# Patient Record
Sex: Female | Born: 1975 | Race: Black or African American | Hispanic: Yes | Marital: Married | Smoking: Never smoker
Health system: Southern US, Community
[De-identification: ages and names within clinical notes are randomized; demographics above are authoritative.]

## PROBLEM LIST (undated history)

## (undated) DIAGNOSIS — Z973 Presence of spectacles and contact lenses: Secondary | ICD-10-CM

## (undated) DIAGNOSIS — M778 Other enthesopathies, not elsewhere classified: Secondary | ICD-10-CM

---

## 1998-03-07 ENCOUNTER — Other Ambulatory Visit: Admission: RE | Admit: 1998-03-07 | Discharge: 1998-03-07 | Payer: Self-pay | Admitting: Obstetrics and Gynecology

## 2018-08-23 DIAGNOSIS — M25871 Other specified joint disorders, right ankle and foot: Secondary | ICD-10-CM

## 2018-08-23 HISTORY — DX: Other specified joint disorders, right ankle and foot: M25.871

## 2018-10-05 DIAGNOSIS — M5431 Sciatica, right side: Secondary | ICD-10-CM

## 2018-10-05 HISTORY — DX: Sciatica, right side: M54.31

## 2020-10-09 ENCOUNTER — Other Ambulatory Visit: Payer: Self-pay

## 2020-10-09 ENCOUNTER — Emergency Department (INDEPENDENT_AMBULATORY_CARE_PROVIDER_SITE_OTHER)
Admission: EM | Admit: 2020-10-09 | Discharge: 2020-10-09 | Disposition: A | Discharge status: Home / Self Care | Payer: BC Managed Care – PPO | Source: Home / Self Care

## 2020-10-09 DIAGNOSIS — Z20822 Contact with and (suspected) exposure to covid-19: Secondary | ICD-10-CM

## 2020-10-09 DIAGNOSIS — B349 Viral infection, unspecified: Secondary | ICD-10-CM

## 2020-10-09 NOTE — Discharge Instructions (Signed)
Your COVID 19 results will be available in 3-5 days. Negative results are immediately resulted to Mychart. Positive results will receive a follow-up call from our clinic. If symptoms are present, I recommend home quarantine until results are known.  °

## 2020-10-09 NOTE — ED Provider Notes (Signed)
[facility]    CSN: [id number] Arrival date & time: 10/09/20  0844      History   Chief Complaint Chief Complaint  Patient presents with  . Sore Throat  . Generalized Body Aches  . Chills    HPI Wendy Wu is a 45 y.o. female.   HPI Patient presents with URI symptoms including sore throat, generalized body aches, chills, nasal congestion. Known COVID exposure. COVID Vaccinated: Y Denies worrisome symptoms of shortness of breath, weakness, N&V, chest pain.  History reviewed. No pertinent past medical history.  There are no problems to display for this patient.   History reviewed. No pertinent surgical history.  OB History   No obstetric history on file.      Home Medications    Prior to Admission medications   Not on File    Family History History reviewed. No pertinent family history.  Social History Social History   Tobacco Use  . Smoking status: Never Smoker  . Smokeless tobacco: Never Used  Vaping Use  . Vaping Use: Never used  Substance Use Topics  . Alcohol use: Not Currently     Allergies   Patient has no known allergies.   Review of Systems Review of Systems Pertinent negatives listed in HPI   Physical Exam Triage Vital Signs ED Triage Vitals  Enc Vitals Group     BP 10/09/20 0908 115/77     Pulse Rate 10/09/20 0908 92     Resp 10/09/20 0908 18     Temp 10/09/20 0915 99.6 F (37.6 C)     Temp Source 10/09/20 0908 Oral     SpO2 10/09/20 0908 96 %     Weight --      Height --      Head Circumference --      Peak Flow --      Pain Score 10/09/20 0908 5     Pain Loc --      Pain Edu? --      Excl. in GC? --    No data found.  Updated Vital Signs BP 115/77 (BP Location: Right Arm)   Pulse 92   Temp 99.6 F (37.6 C) (Oral)   Resp 18   SpO2 96%   Visual Acuity Right Eye Distance:   Left Eye Distance:   Bilateral Distance:    Right Eye Near:   Left Eye Near:    Bilateral Near:     Physical  Exam  General Appearance:    Alert, acutely ill-appearing, cooperative, no distress  HENT:   Normocephalic, ears normal, nares mucosal edema with congestion, rhinorrhea, oropharynx w/o exudate  Eyes:    PERRL, conjunctiva/corneas clear, EOM's intact       Lungs:     Clear to auscultation bilaterally, respirations unlabored  Heart:    Regular rate and rhythm  Neurologic:   Awake, alert, oriented x 3. No apparent focal neurological           defect.      UC Treatments / Results  Labs (all labs ordered are listed, but only abnormal results are displayed) Labs Reviewed - No data to display  EKG   Radiology No results found.  Procedures Procedures (including critical care time)  Medications Ordered in UC Medications - No data to display  Initial Impression / Assessment and Plan / UC Course  I have reviewed the triage vital signs and the nursing notes.  Pertinent labs & imaging results that were available during  my care of the patient were reviewed by me and considered in my medical decision making (see chart for details).    COVID/Flu test pending. Symptom management warranted only.  Manage fever with Tylenol and ibuprofen.  Nasal symptoms with over-the-counter antihistamines recommended.  Treatment per discharge medications/discharge instructions.  Red flags/ER precautions given. The most current CDC isolation/quarantine recommendation advised.   Final Clinical Impressions(s) / UC Diagnoses   Final diagnoses:  Viral illness  Close exposure to COVID-19 virus     Discharge Instructions     Your COVID 19 results will be available in 3-5 days. Negative results are immediately resulted to Mychart. Positive results will receive a follow-up call from our clinic. If symptoms are present, I recommend home quarantine until results are known.    ED Prescriptions    None     PDMP not reviewed this encounter.   Bing Neighbors, FNP 10/16/20 1103

## 2020-10-09 NOTE — ED Triage Notes (Signed)
Pt c/o sore throat, bodyaches and chills since yesterday. Also says mid morning started feeling some abd pain. Pain 5/10. Advil prn.  [facility], tested neg. Husband tested pos here at [facility] on Saturday. Has had J&J covid vaccine last July.

## 2020-10-10 ENCOUNTER — Ambulatory Visit: Payer: Self-pay | Admitting: Family Medicine

## 2020-10-11 LAB — COVID-19, FLU A+B NAA
Influenza A, NAA: NOT DETECTED
Influenza B, NAA: NOT DETECTED
SARS-CoV-2, NAA: DETECTED — AB

## 2020-10-18 DIAGNOSIS — U071 COVID-19: Secondary | ICD-10-CM

## 2020-10-18 HISTORY — DX: COVID-19: U07.1

## 2020-10-22 ENCOUNTER — Ambulatory Visit: Payer: Self-pay | Admitting: Family Medicine

## 2020-10-22 NOTE — Progress Notes (Signed)
[facility] [address] [place] [address] Phone: [phone] Subjective:   Bruce Donath, am serving as a scribe for Dr. Antoine Primas. This visit occurred during the SARS-CoV-2 public health emergency.  Safety protocols were in place, including screening questions prior to the visit, additional usage of staff PPE, and extensive cleaning of exam room while observing appropriate contact time as indicated for disinfecting solutions.   I'm seeing this patient by the request  of:  Pcp, No  CC: Low back pain  [id number]  Wendy Wu is a 45 y.o. female coming in with complaint of low back pain for one month. Patient states that she has pain in right SI joint that radiates down into hamstring to the left heel. Using Advil for pain relief. History of back injury in high school after doing squats. Was getting B12 injection in [place] 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 Had a custom brace molded for patient during 2nd pregnancy at age 21. Working at [employer] for past 14 years at [employer] and moves and lifts heavy items frequently. Pain increases with lumbar flexion. Saw another chiropractor 5 years ago and was told that she has a bulging disc. Has used heat, ice, and IBU for pain relief since middle of December, 600mg  daily.   Also peroneal tendonitis on right ankle. Was seen by provider in [facility] and given injection. Pain with PF.      No past medical history on file. No past surgical history on file. Social History   Socioeconomic History  . Marital status: Married    Spouse name: Not on file  . Number of children: Not on file  . Years of education: Not on file  . Highest education level: Not on file  Occupational History  . Not on file  Tobacco Use  . Smoking status: Never Smoker  . Smokeless tobacco: Never Used  Vaping Use  . Vaping Use: Never  used  Substance and Sexual Activity  . Alcohol use: Not Currently  . Drug use: Not on file  . Sexual activity: Not on file  Other Topics Concern  . Not on file  Social History Narrative   ** Merged History Encounter **       Social Determinants of Health   Financial Resource Strain: Not on file  Food Insecurity: Not on file  Transportation Needs: Not on file  Physical Activity: Not on file  Stress: Not on file  Social Connections: Not on file   No Known Allergies No family history on file.       Current Outpatient Medications (Other):  .  gabapentin (NEURONTIN) 100 MG capsule, Take 2 capsules (200 mg total) by mouth at bedtime.   Reviewed prior external information including notes and imaging from  primary care provider As well as notes that were available from care everywhere and other healthcare systems.  Past medical history, social, surgical and family history all reviewed in electronic medical record.  No pertanent information unless stated regarding to the chief complaint.   Review of Systems:  No headache, visual changes, nausea, vomiting, diarrhea, constipation, dizziness, abdominal pain, skin rash, fevers, chills, night sweats, weight loss, swollen lymph nodes  joint swelling, chest pain, shortness of breath, mood changes. POSITIVE muscle aches, body aches  Objective  Blood pressure 118/80, pulse 81, weight 181 lb (82.1 kg), last menstrual period 10/09/2020, SpO2  99 %.   General: No apparent distress alert and oriented x3 mood and affect normal, dressed appropriately.  HEENT: Pupils equal, extraocular movements intact  Respiratory: Patient's speak in full sentences and does not appear short of breath  Cardiovascular: No lower extremity edema, non tender, no erythema  Gait favoring right side  MSK: Right knee unremarkable but with straight leg test patient has worsening pain on the lateral aspect of the knee.  No significant instability of the knee.  Patient  points to more pain on the posterior aspect of the knee.  No masses appreciated.  No pain in the calf.  No swelling of the lower extremities compared to the contralateral side Low back exam does have significant loss of lordosis.  Patient actually has tenderness to palpation in the paraspinal musculature right greater than left.  Positive straight leg at 25 degrees of forward flexion with radicular symptoms in the sciatic nerve or S1 distribution.  Patient may have some very mild weakness with dorsiflexion.  Deep tendon reflexes though appear to be intact possibly 1+ on the right and 2+ on the left there is some asymmetry noted.  Limited range of motion of the back secondary to voluntary guarding.    Impression and Recommendations:     The above documentation has been reviewed and is accurate and complete Judi Saa, DO

## 2020-10-23 ENCOUNTER — Other Ambulatory Visit: Payer: Self-pay

## 2020-10-23 ENCOUNTER — Ambulatory Visit (INDEPENDENT_AMBULATORY_CARE_PROVIDER_SITE_OTHER): Payer: BC Managed Care – PPO

## 2020-10-23 ENCOUNTER — Encounter: Payer: Self-pay | Admitting: Family Medicine

## 2020-10-23 ENCOUNTER — Ambulatory Visit (INDEPENDENT_AMBULATORY_CARE_PROVIDER_SITE_OTHER): Payer: BC Managed Care – PPO | Admitting: Family Medicine

## 2020-10-23 VITALS — BP 118/80 | HR 81 | Wt 181.0 lb

## 2020-10-23 DIAGNOSIS — M2550 Pain in unspecified joint: Secondary | ICD-10-CM | POA: Diagnosis not present

## 2020-10-23 DIAGNOSIS — M545 Low back pain, unspecified: Secondary | ICD-10-CM | POA: Diagnosis not present

## 2020-10-23 DIAGNOSIS — M5416 Radiculopathy, lumbar region: Secondary | ICD-10-CM | POA: Diagnosis not present

## 2020-10-23 LAB — CBC WITH DIFFERENTIAL/PLATELET
Basophils Absolute: 0 10*3/uL (ref 0.0–0.1)
Basophils Relative: 0.2 % (ref 0.0–3.0)
Eosinophils Absolute: 0 10*3/uL (ref 0.0–0.7)
Eosinophils Relative: 0.7 % (ref 0.0–5.0)
HCT: 38 % (ref 36.0–46.0)
Hemoglobin: 12.5 g/dL (ref 12.0–15.0)
Lymphocytes Relative: 26.7 % (ref 12.0–46.0)
Lymphs Abs: 1.8 10*3/uL (ref 0.7–4.0)
MCHC: 32.9 g/dL (ref 30.0–36.0)
MCV: 85 fl (ref 78.0–100.0)
Monocytes Absolute: 0.5 10*3/uL (ref 0.1–1.0)
Monocytes Relative: 7.8 % (ref 3.0–12.0)
Neutro Abs: 4.5 10*3/uL (ref 1.4–7.7)
Neutrophils Relative %: 64.6 % (ref 43.0–77.0)
Platelets: 518 10*3/uL — ABNORMAL HIGH (ref 150.0–400.0)
RBC: 4.47 Mil/uL (ref 3.87–5.11)
RDW: 13.6 % (ref 11.5–15.5)
WBC: 6.9 10*3/uL (ref 4.0–10.5)

## 2020-10-23 LAB — COMPREHENSIVE METABOLIC PANEL
ALT: 15 U/L (ref 0–35)
AST: 14 U/L (ref 0–37)
Albumin: 4.6 g/dL (ref 3.5–5.2)
Alkaline Phosphatase: 34 U/L — ABNORMAL LOW (ref 39–117)
BUN: 13 mg/dL (ref 6–23)
CO2: 30 mEq/L (ref 19–32)
Calcium: 9.9 mg/dL (ref 8.4–10.5)
Chloride: 103 mEq/L (ref 96–112)
Creatinine, Ser: 0.8 mg/dL (ref 0.40–1.20)
GFR: 88.84 mL/min (ref >60.00)
Glucose, Bld: 81 mg/dL (ref 70–99)
Potassium: 3.9 mEq/L (ref 3.5–5.1)
Sodium: 138 mEq/L (ref 135–145)
Total Bilirubin: 0.4 mg/dL (ref 0.2–1.2)
Total Protein: 7.6 g/dL (ref 6.0–8.3)

## 2020-10-23 LAB — IBC PANEL
Iron: 102 ug/dL (ref 42–145)
Saturation Ratios: 30.6 % (ref 20.0–50.0)
Transferrin: 238 mg/dL (ref 212.0–360.0)

## 2020-10-23 LAB — VITAMIN D 25 HYDROXY (VIT D DEFICIENCY, FRACTURES): VITD: 16.15 ng/mL — ABNORMAL LOW (ref 30.00–100.00)

## 2020-10-23 LAB — TSH: TSH: 1.07 u[IU]/mL (ref 0.35–4.50)

## 2020-10-23 LAB — SEDIMENTATION RATE: Sed Rate: 23 mm/hr — ABNORMAL HIGH (ref 0–20)

## 2020-10-23 LAB — URIC ACID: Uric Acid, Serum: 2.8 mg/dL (ref 2.4–7.0)

## 2020-10-23 MED ORDER — GABAPENTIN 100 MG PO CAPS: 200.0000 mg | ORAL_CAPSULE | Freq: Every day | ORAL | 0 refills | Status: DC

## 2020-10-23 NOTE — Assessment & Plan Note (Signed)
Patient is having lumbar radiculopathy.  No significant weakness but potentially some deep tendon reflexes decreased on the side.  We will get x-rays to further evaluate but I do feel with patient already failing formal physical therapy, chiropractor, over-the-counter medications and years of pain I do feel advanced imaging is warranted at this time.  Patient will have the MRI and then we will further evaluate to see if patient is a candidate for possible epidurals versus the possibility of surgical intervention.  We will get some laboratory work-up as well to make sure nothing else is contributing with patient not having a primary care provider.  Encouraged her to get a primary care provider soon.  Started on gabapentin for the radicular symptoms and patient can continue with the anti-inflammatories.  Patient declined multiple different medications including prednisone today.  Patient will follow-up after imaging and discuss otherwise.

## 2020-10-23 NOTE — Patient Instructions (Addendum)
MRI lumbar [facility] [phone] Gabapentin 200mg  at night Xray right knee today Labs today We will be in touch after MRI

## 2020-10-24 ENCOUNTER — Telehealth: Payer: Self-pay | Admitting: Family Medicine

## 2020-10-24 ENCOUNTER — Other Ambulatory Visit: Payer: Self-pay

## 2020-10-24 MED ORDER — GABAPENTIN 100 MG PO CAPS: 200.0000 mg | ORAL_CAPSULE | Freq: Every day | ORAL | 0 refills | Status: DC

## 2020-10-24 NOTE — Telephone Encounter (Signed)
Rx called in . Patient notified

## 2020-10-24 NOTE — Telephone Encounter (Signed)
Patient called stating that her prescription coverage changed so [facility] are [facility]. [facility] [employer] [facility] [address] in [place], [place]?

## 2020-10-28 ENCOUNTER — Telehealth: Payer: Self-pay | Admitting: Family Medicine

## 2020-10-28 ENCOUNTER — Ambulatory Visit: Payer: Self-pay | Admitting: Family Medicine

## 2020-10-28 NOTE — Telephone Encounter (Signed)
Sent patient MyChart message with update. 

## 2020-10-28 NOTE — Telephone Encounter (Signed)
Pt feels like gabapentin has not made any difference in her pain, still wakes up and is still taking Advil 2-3 times a day. Has been taking the gabapentin since 1/20.  Also noted constant tingling in R foot.  MRI scheduled at [facility] for 2/8, she is willing to go elsewhere if she can get I earlier.

## 2020-10-28 NOTE — Telephone Encounter (Signed)
I would give the gabapentin more time,  If she wants we can check Wendy Wu if that is any better for the MRI

## 2020-11-02 ENCOUNTER — Other Ambulatory Visit: Payer: Self-pay

## 2020-11-02 ENCOUNTER — Ambulatory Visit (INDEPENDENT_AMBULATORY_CARE_PROVIDER_SITE_OTHER): Payer: BC Managed Care – PPO

## 2020-11-02 DIAGNOSIS — M5136 Other intervertebral disc degeneration, lumbar region: Secondary | ICD-10-CM

## 2020-11-02 DIAGNOSIS — M545 Low back pain, unspecified: Secondary | ICD-10-CM

## 2020-11-02 DIAGNOSIS — M5126 Other intervertebral disc displacement, lumbar region: Secondary | ICD-10-CM | POA: Diagnosis not present

## 2020-11-05 ENCOUNTER — Other Ambulatory Visit: Payer: Self-pay

## 2020-11-05 ENCOUNTER — Telehealth: Payer: Self-pay | Admitting: *Deleted

## 2020-11-05 DIAGNOSIS — M5416 Radiculopathy, lumbar region: Secondary | ICD-10-CM

## 2020-11-05 NOTE — Telephone Encounter (Signed)
Spoke to pt, she is interested in the injection but would like more detailed information.

## 2020-11-05 NOTE — Telephone Encounter (Signed)
Patient is going to write back tomorrow if she would like to do a phone conversation at 4:15pm on 11/07/2020.

## 2020-11-06 NOTE — Progress Notes (Signed)
Virtual Visit via Video Note  I connected with Wendy Wu on 11/06/20 at  4:15 PM EST by a video enabled telemedicine application and verified that I am speaking with the correct person using two identifiers.  Had difficulty with virtual platform so did the phone call.  Patient is accompanied with husband  Location: Patient: And home setting Provider: In office setting   I discussed the limitations of evaluation and management by telemedicine and the availability of in person appointments. The patient expressed understanding and agreed to proceed.  History of Present Illness: 45 year old female who is coming in with back pain and was found to have a positive straight leg test with radicular symptoms in the S1 distribution with mild decrease in deep tendon reflexes.  Sent to have an MRI.  MRI did show that patient had an L5-S1 right paracentral extrusion with right-sided S1 compression at the subarticular recess.    Observations/Objective:alert and asked good questions    Assessment and Plan: right sided nerve root impingement S1.  Discussed different options.  Discussed epidural, nerve root injection, or surgical intervention  We discussed gabapentin, discussed concern with the weakness noted on exam.  Patient though would like to consider nerve root injection first.  Continue the    Follow Up Instructions: 4 weeks after injection if worsening symptoms patient is to call or send a my chart message and we will send patient to surgery.  Patient is in agreement with the plan     I discussed the assessment and treatment plan with the patient. The patient was provided an opportunity to ask questions and all were answered. The patient agreed with the plan and demonstrated an understanding of the instructions.   The patient was advised to call back or seek an in-person evaluation if the symptoms worsen or if the condition fails to improve as anticipated.  I provided of  non-face-to-face time during this encounter.   Judi Saa, DO

## 2020-11-07 ENCOUNTER — Encounter: Payer: Self-pay | Admitting: Family Medicine

## 2020-11-07 ENCOUNTER — Ambulatory Visit (INDEPENDENT_AMBULATORY_CARE_PROVIDER_SITE_OTHER): Payer: BC Managed Care – PPO | Admitting: Family Medicine

## 2020-11-07 DIAGNOSIS — M5416 Radiculopathy, lumbar region: Secondary | ICD-10-CM

## 2020-11-07 HISTORY — DX: Radiculopathy, lumbar region: M54.16

## 2020-11-08 ENCOUNTER — Ambulatory Visit: Payer: BC Managed Care – PPO | Admitting: Family Medicine

## 2020-11-12 ENCOUNTER — Other Ambulatory Visit: Payer: BC Managed Care – PPO

## 2020-11-18 ENCOUNTER — Other Ambulatory Visit: Payer: Self-pay

## 2020-11-18 ENCOUNTER — Ambulatory Visit
Admission: RE | Admit: 2020-11-18 | Discharge: 2020-11-18 | Disposition: A | Discharge status: Home / Self Care | Payer: BC Managed Care – PPO | Source: Ambulatory Visit | Attending: Family Medicine | Admitting: Family Medicine

## 2020-11-18 DIAGNOSIS — M5416 Radiculopathy, lumbar region: Secondary | ICD-10-CM

## 2020-11-18 IMAGING — XA DG EPIDURAL NERVE ROOT
2 series · 2 of 2 positions shown · non-contrast
Comparison: none

CLINICAL DATA: Lumbosacral spondylosis without myelopathy. Right
leg pain. Disc extrusion at L5-S1 with right S1 nerve root
impingement.

[Series 1: ortho adipose · 1 of 1 slices shown (1 of 2)]
[im 1/1]
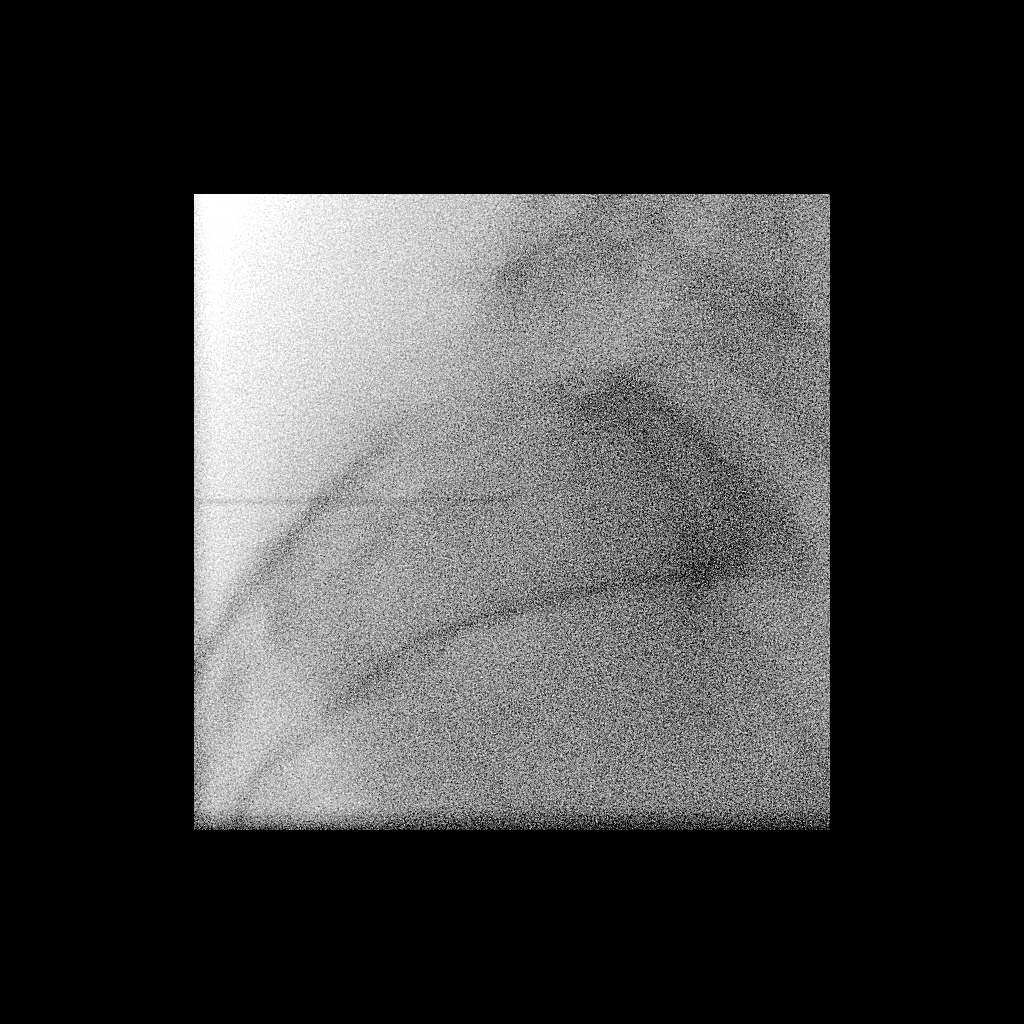

[Series 3: ortho adipose · 1 of 1 slices shown (2 of 2)]
[im 1/1]
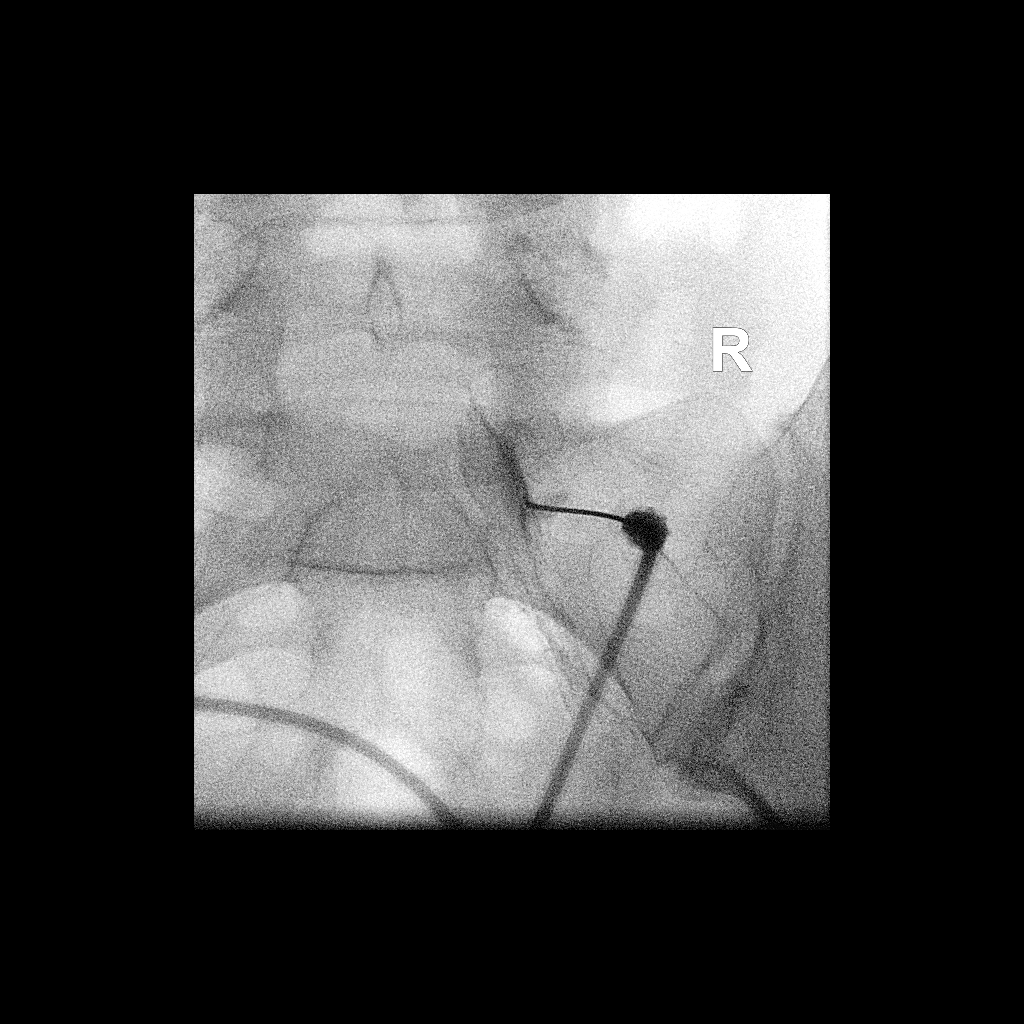

[2 of 2 positions shown; findings below may reference images not displayed]

EXAM:
EPIDURAL/NERVE ROOT

FLUOROSCOPY TIME:  Fluoroscopy Time: 23 seconds

Radiation Exposure Index: 21.1 microGray*m^2

PROCEDURE:
The procedure, risks, benefits, and alternatives were explained to
the patient. Questions regarding the procedure were encouraged and
answered. The patient understands and consents to the procedure.

RIGHT S1 NERVE ROOT BLOCK AND TRANSFORAMINAL EPIDURAL: A posterior
oblique approach was taken to the intervertebral foramen on the
right at the S1 level using a curved 3.5 inch 22 gauge spinal
needle. Injection of Isovue-M 200 outlined the right S1 nerve root
and showed good epidural spread. No vascular opacification is seen.
120 mg of Depo-Medrol mixed with 1.5 mL of 1% lidocaine were
instilled. The injection resulted in concordant pain. The procedure
was well-tolerated, and the patient was discharged thirty minutes
following the injection in good condition.

COMPLICATIONS:
None
IMPRESSION: Technically successful injection consisting of a right S1 nerve root
block and transforaminal epidural.

## 2020-11-18 MED ORDER — METHYLPREDNISOLONE ACETATE 40 MG/ML INJ SUSP (RADIOLOG
120.0000 mg | Freq: Once | INTRAMUSCULAR | Status: AC
  Administered 2020-11-18: 120 mg via EPIDURAL

## 2020-11-18 MED ORDER — IOPAMIDOL (ISOVUE-M 200) INJECTION 41%
1.0000 mL | Freq: Once | INTRAMUSCULAR | Status: AC
  Administered 2020-11-18: 1 mL via EPIDURAL

## 2020-11-18 NOTE — Discharge Instructions (Signed)

## 2021-07-12 ENCOUNTER — Other Ambulatory Visit: Payer: Self-pay | Admitting: Obstetrics and Gynecology

## 2021-09-09 ENCOUNTER — Other Ambulatory Visit: Payer: Self-pay

## 2021-09-09 ENCOUNTER — Encounter (HOSPITAL_BASED_OUTPATIENT_CLINIC_OR_DEPARTMENT_OTHER): Payer: Self-pay | Admitting: Obstetrics and Gynecology

## 2021-09-09 NOTE — Progress Notes (Signed)
Spoke w/ via phone for pre-op interview---pt Lab needs dos---- Urine pregnancy, CBC              Lab results------none COVID test -----patient states asymptomatic no test needed Arrive at -------0530 on 09/12/21 NPO after MN NO Solid Food.  Clear liquids from MN until---0430 on 09/12/21 Med rec completed Medications to take morning of surgery -----none Diabetic medication -----n/a Patient instructed no nail polish to be worn day of surgery Patient instructed to bring photo id and insurance card day of surgery Patient aware to have Driver (ride ) / caregiver    for 24 hours after surgery - friend Patient Special Instructions -----none Pre-Op special Istructions -----none Patient verbalized understanding of instructions that were given at this phone interview. Patient denies shortness of breath, chest pain, fever, cough at this phone interview.

## 2021-09-12 ENCOUNTER — Encounter (HOSPITAL_BASED_OUTPATIENT_CLINIC_OR_DEPARTMENT_OTHER)
Admission: RE | Disposition: A | Discharge status: Home / Self Care | Payer: Self-pay | Source: Home / Self Care | Attending: Obstetrics and Gynecology

## 2021-09-12 ENCOUNTER — Ambulatory Visit (HOSPITAL_BASED_OUTPATIENT_CLINIC_OR_DEPARTMENT_OTHER): Payer: BC Managed Care – PPO | Admitting: Anesthesiology

## 2021-09-12 ENCOUNTER — Other Ambulatory Visit: Payer: Self-pay

## 2021-09-12 ENCOUNTER — Encounter (HOSPITAL_BASED_OUTPATIENT_CLINIC_OR_DEPARTMENT_OTHER): Payer: Self-pay | Admitting: Obstetrics and Gynecology

## 2021-09-12 ENCOUNTER — Ambulatory Visit (HOSPITAL_BASED_OUTPATIENT_CLINIC_OR_DEPARTMENT_OTHER)
Admission: RE | Admit: 2021-09-12 | Discharge: 2021-09-12 | Disposition: A | Discharge status: Home / Self Care | Payer: BC Managed Care – PPO | Attending: Obstetrics and Gynecology | Admitting: Obstetrics and Gynecology

## 2021-09-12 DIAGNOSIS — Z302 Encounter for sterilization: Secondary | ICD-10-CM | POA: Diagnosis present

## 2021-09-12 DIAGNOSIS — Z780 Asymptomatic menopausal state: Secondary | ICD-10-CM | POA: Insufficient documentation

## 2021-09-12 HISTORY — DX: Other enthesopathies, not elsewhere classified: M77.8

## 2021-09-12 HISTORY — PX: LAPAROSCOPIC TUBAL LIGATION: SHX1937

## 2021-09-12 HISTORY — DX: Presence of spectacles and contact lenses: Z97.3

## 2021-09-12 LAB — CBC
HCT: 39.7 % (ref 36.0–46.0)
Hemoglobin: 12.6 g/dL (ref 12.0–15.0)
MCH: 28.3 pg (ref 26.0–34.0)
MCHC: 31.7 g/dL (ref 30.0–36.0)
MCV: 89 fL (ref 80.0–100.0)
Platelets: 379 10*3/uL (ref 150–400)
RBC: 4.46 MIL/uL (ref 3.87–5.11)
RDW: 13 % (ref 11.5–15.5)
WBC: 8.2 10*3/uL (ref 4.0–10.5)
nRBC: 0 % (ref 0.0–0.2)

## 2021-09-12 LAB — POCT PREGNANCY, URINE: Preg Test, Ur: NEGATIVE

## 2021-09-12 SURGERY — LIGATION, FALLOPIAN TUBE, LAPAROSCOPIC
Anesthesia: General | Site: Abdomen

## 2021-09-12 MED ORDER — ONDANSETRON HCL 4 MG/2ML IJ SOLN
INTRAMUSCULAR | Status: AC
  Filled 2021-09-12: qty 2

## 2021-09-12 MED ORDER — AMISULPRIDE (ANTIEMETIC) 5 MG/2ML IV SOLN: 10.0000 mg | Freq: Once | INTRAVENOUS | Status: DC | PRN

## 2021-09-12 MED ORDER — ROCURONIUM BROMIDE 10 MG/ML (PF) SYRINGE
PREFILLED_SYRINGE | INTRAVENOUS | Status: AC
  Filled 2021-09-12: qty 10

## 2021-09-12 MED ORDER — PROPOFOL 10 MG/ML IV BOLUS
INTRAVENOUS | Status: AC
  Filled 2021-09-12: qty 20

## 2021-09-12 MED ORDER — PROMETHAZINE HCL 25 MG/ML IJ SOLN: 6.2500 mg | INTRAMUSCULAR | Status: DC | PRN

## 2021-09-12 MED ORDER — DEXAMETHASONE SODIUM PHOSPHATE 10 MG/ML IJ SOLN
INTRAMUSCULAR | Status: DC | PRN
  Administered 2021-09-12: 10 mg via INTRAVENOUS

## 2021-09-12 MED ORDER — MIDAZOLAM HCL 2 MG/2ML IJ SOLN
INTRAMUSCULAR | Status: AC
  Filled 2021-09-12: qty 2

## 2021-09-12 MED ORDER — ROCURONIUM BROMIDE 10 MG/ML (PF) SYRINGE
PREFILLED_SYRINGE | INTRAVENOUS | Status: DC | PRN
  Administered 2021-09-12: 60 mg via INTRAVENOUS

## 2021-09-12 MED ORDER — BUPIVACAINE HCL (PF) 0.25 % IJ SOLN
INTRAMUSCULAR | Status: DC | PRN
  Administered 2021-09-12: 5 mL

## 2021-09-12 MED ORDER — OXYCODONE HCL 5 MG/5ML PO SOLN: 5.0000 mg | Freq: Once | ORAL | Status: DC | PRN

## 2021-09-12 MED ORDER — PHENYLEPHRINE 40 MCG/ML (10ML) SYRINGE FOR IV PUSH (FOR BLOOD PRESSURE SUPPORT)
PREFILLED_SYRINGE | INTRAVENOUS | Status: DC | PRN
  Administered 2021-09-12 (×2): 120 ug via INTRAVENOUS
  Administered 2021-09-12: 80 ug via INTRAVENOUS
  Administered 2021-09-12: 40 ug via INTRAVENOUS

## 2021-09-12 MED ORDER — EPHEDRINE 5 MG/ML INJ
INTRAVENOUS | Status: AC
  Filled 2021-09-12: qty 5

## 2021-09-12 MED ORDER — KETOROLAC TROMETHAMINE 30 MG/ML IJ SOLN
INTRAMUSCULAR | Status: AC
  Filled 2021-09-12: qty 1

## 2021-09-12 MED ORDER — LIDOCAINE 2% (20 MG/ML) 5 ML SYRINGE
INTRAMUSCULAR | Status: AC
  Filled 2021-09-12: qty 5

## 2021-09-12 MED ORDER — FENTANYL CITRATE (PF) 100 MCG/2ML IJ SOLN
INTRAMUSCULAR | Status: AC
  Filled 2021-09-12: qty 2

## 2021-09-12 MED ORDER — DEXAMETHASONE SODIUM PHOSPHATE 10 MG/ML IJ SOLN
INTRAMUSCULAR | Status: AC
  Filled 2021-09-12: qty 1

## 2021-09-12 MED ORDER — IBUPROFEN 800 MG PO TABS: 800.0000 mg | ORAL_TABLET | Freq: Four times a day (QID) | ORAL | 6 refills | Status: AC | PRN

## 2021-09-12 MED ORDER — 0.9 % SODIUM CHLORIDE (POUR BTL) OPTIME
TOPICAL | Status: DC | PRN
  Administered 2021-09-12: 500 mL

## 2021-09-12 MED ORDER — FENTANYL CITRATE (PF) 250 MCG/5ML IJ SOLN
INTRAMUSCULAR | Status: AC
  Filled 2021-09-12: qty 5

## 2021-09-12 MED ORDER — ACETAMINOPHEN 10 MG/ML IV SOLN: 1000.0000 mg | Freq: Once | INTRAVENOUS | Status: DC | PRN

## 2021-09-12 MED ORDER — ACETAMINOPHEN 160 MG/5ML PO SOLN: 325.0000 mg | ORAL | Status: DC | PRN

## 2021-09-12 MED ORDER — POVIDONE-IODINE 10 % EX SWAB: 2.0000 "application " | Freq: Once | CUTANEOUS | Status: DC

## 2021-09-12 MED ORDER — FENTANYL CITRATE (PF) 100 MCG/2ML IJ SOLN
25.0000 ug | INTRAMUSCULAR | Status: DC | PRN
  Administered 2021-09-12: 25 ug via INTRAVENOUS

## 2021-09-12 MED ORDER — WHITE PETROLATUM EX OINT
TOPICAL_OINTMENT | CUTANEOUS | Status: AC
  Filled 2021-09-12: qty 5

## 2021-09-12 MED ORDER — ACETAMINOPHEN 325 MG PO TABS: 325.0000 mg | ORAL_TABLET | ORAL | Status: DC | PRN

## 2021-09-12 MED ORDER — SCOPOLAMINE 1 MG/3DAYS TD PT72
1.0000 | MEDICATED_PATCH | TRANSDERMAL | Status: DC
  Administered 2021-09-12: 1.5 mg via TRANSDERMAL

## 2021-09-12 MED ORDER — FENTANYL CITRATE (PF) 100 MCG/2ML IJ SOLN
INTRAMUSCULAR | Status: DC | PRN
  Administered 2021-09-12 (×2): 50 ug via INTRAVENOUS
  Administered 2021-09-12: 100 ug via INTRAVENOUS

## 2021-09-12 MED ORDER — ONDANSETRON HCL 4 MG/2ML IJ SOLN
INTRAMUSCULAR | Status: DC | PRN
  Administered 2021-09-12: 4 mg via INTRAVENOUS

## 2021-09-12 MED ORDER — SCOPOLAMINE 1 MG/3DAYS TD PT72
MEDICATED_PATCH | TRANSDERMAL | Status: AC
  Filled 2021-09-12: qty 1

## 2021-09-12 MED ORDER — LIDOCAINE 2% (20 MG/ML) 5 ML SYRINGE
INTRAMUSCULAR | Status: DC | PRN
  Administered 2021-09-12: 40 mg via INTRAVENOUS

## 2021-09-12 MED ORDER — LACTATED RINGERS IV SOLN: INTRAVENOUS | Status: DC

## 2021-09-12 MED ORDER — EPHEDRINE SULFATE-NACL 50-0.9 MG/10ML-% IV SOSY
PREFILLED_SYRINGE | INTRAVENOUS | Status: DC | PRN
  Administered 2021-09-12: 10 mg via INTRAVENOUS

## 2021-09-12 MED ORDER — SUGAMMADEX SODIUM 200 MG/2ML IV SOLN
INTRAVENOUS | Status: DC | PRN
  Administered 2021-09-12: 200 mg via INTRAVENOUS

## 2021-09-12 MED ORDER — OXYCODONE HCL 5 MG PO TABS: 5.0000 mg | ORAL_TABLET | Freq: Once | ORAL | Status: DC | PRN

## 2021-09-12 MED ORDER — KETOROLAC TROMETHAMINE 30 MG/ML IJ SOLN
INTRAMUSCULAR | Status: DC | PRN
  Administered 2021-09-12: 30 mg via INTRAVENOUS

## 2021-09-12 MED ORDER — PROPOFOL 10 MG/ML IV BOLUS
INTRAVENOUS | Status: DC | PRN
  Administered 2021-09-12: 130 mg via INTRAVENOUS
  Administered 2021-09-12: 20 mg via INTRAVENOUS

## 2021-09-12 MED ORDER — MIDAZOLAM HCL 5 MG/5ML IJ SOLN
INTRAMUSCULAR | Status: DC | PRN
  Administered 2021-09-12: 2 mg via INTRAVENOUS

## 2021-09-12 MED ORDER — PHENYLEPHRINE 40 MCG/ML (10ML) SYRINGE FOR IV PUSH (FOR BLOOD PRESSURE SUPPORT)
PREFILLED_SYRINGE | INTRAVENOUS | Status: AC
  Filled 2021-09-12: qty 10

## 2021-09-12 SURGICAL SUPPLY — 23 items
ADH SKN CLS APL DERMABOND .7 (GAUZE/BANDAGES/DRESSINGS) ×1
CATH ROBINSON RED A/P 16FR (CATHETERS) ×2 IMPLANT
DERMABOND ADVANCED (GAUZE/BANDAGES/DRESSINGS) ×1
DERMABOND ADVANCED .7 DNX12 (GAUZE/BANDAGES/DRESSINGS) ×1 IMPLANT
DURAPREP 26ML APPLICATOR (WOUND CARE) ×2 IMPLANT
GAUZE 4X4 16PLY ~~LOC~~+RFID DBL (SPONGE) ×2 IMPLANT
GLOVE SURG LTX SZ6.5 (GLOVE) ×2 IMPLANT
GLOVE SURG UNDER POLY LF SZ7 (GLOVE) ×6 IMPLANT
GOWN STRL REUS W/TWL LRG LVL3 (GOWN DISPOSABLE) ×4 IMPLANT
KIT TURNOVER CYSTO (KITS) ×2 IMPLANT
LEGGING LITHOTOMY PAIR STRL (DRAPES) ×2 IMPLANT
NEEDLE INSUFFLATION 120MM (ENDOMECHANICALS) ×2 IMPLANT
NS IRRIG 500ML POUR BTL (IV SOLUTION) ×2 IMPLANT
PACK LAPAROSCOPY BASIN (CUSTOM PROCEDURE TRAY) ×2 IMPLANT
PACK TRENDGUARD 450 HYBRID PRO (MISCELLANEOUS) ×1 IMPLANT
PROTECTOR NERVE ULNAR (MISCELLANEOUS) ×4 IMPLANT
SET TUBE SMOKE EVAC HIGH FLOW (TUBING) ×2 IMPLANT
SUT VIC AB 4-0 PS2 18 (SUTURE) ×4 IMPLANT
SUT VICRYL 0 UR6 27IN ABS (SUTURE) ×2 IMPLANT
TRENDGUARD 450 HYBRID PRO PACK (MISCELLANEOUS) ×2
TROCAR OPTI TIP 5M 100M (ENDOMECHANICALS) ×2 IMPLANT
TROCAR XCEL DIL TIP R 11M (ENDOMECHANICALS) ×2 IMPLANT
WARMER LAPAROSCOPE (MISCELLANEOUS) ×2 IMPLANT

## 2021-09-12 NOTE — Anesthesia Postprocedure Evaluation (Signed)
Anesthesia Post Note  Patient: Wendy Wu  Procedure(s) Performed: LAPAROSCOPIC TUBAL LIGATION (Abdomen)     Patient location during evaluation: PACU Anesthesia Type: General Level of consciousness: awake and alert Pain management: pain level controlled Vital Signs Assessment: post-procedure vital signs reviewed and stable Respiratory status: spontaneous breathing, nonlabored ventilation, respiratory function stable and patient connected to nasal cannula oxygen Cardiovascular status: blood pressure returned to baseline and stable Postop Assessment: no apparent nausea or vomiting Anesthetic complications: no   No notable events documented.  Last Vitals:  Vitals:   09/12/21 0915 09/12/21 0927  BP: 105/69   Pulse: 88 65  Resp: (!) 21 10  Temp:    SpO2: 97% 97%    Last Pain:  Vitals:   09/12/21 0927  TempSrc:   PainSc: 3                  Shelton Silvas

## 2021-09-12 NOTE — Op Note (Signed)
NAMEANALYSSE, Wendy Wu MEDICAL RECORD NO: [id number] ACCOUNT NO: [id number] DATE OF BIRTH: 04-17-1976 FACILITY: [facility] LOCATION: [facility] PHYSICIAN: Memorie Yokoyama A. Cherly Hensen, MD  Operative Report   DATE OF PROCEDURE: 09/12/2021  PREOPERATIVE DIAGNOSIS:  Desires sterilization, previous cesarean section.  PROCEDURE:  Laparoscopic tubal ligation with bipolar cautery.  POSTOPERATIVE DIAGNOSIS:  Desires sterilization, previous cesarean section.  ANESTHESIA:  General.  SURGEON:  Maxie Better, MD  ASSISTANT:  None.  DESCRIPTION OF PROCEDURE:  Under adequate general anesthesia, the patient was placed in the dorsal lithotomy position.  She was sterilely prepped and draped in the usual fashion.  The patient voided prior to entering the room, therefore she was not  catheterized.  A bivalve speculum was placed in the vagina.  A single-tooth tenaculum was placed on the anterior lip of the cervix.  An acorn cannula was introduced into the cervical os and attached to the tenaculum for manipulation of the uterus.   Bivalve speculum was then removed.  Attention was then turned to the abdomen.  0.25% Marcaine was injected infraumbilical.  An infraumbilical vertical incision was then made.  A Veress needle was introduced.  Opening pressure of 4 was noted.  2.7 liters  of CO2 was insufflated after normal saline had tested the Veress needle.  The Veress needle was then removed.  A 10 mm disposable trocar was introduced into the abdomen without incident.  The lighted video laparoscope was then inserted, confirming entry  into the abdomen without incident.  Panoramic inspection was notable for normal liver edge.  The patient was placed in deep Trendelenburg position.  0.25% Marcaine was injected in the suprapubic site.  A small incision was then made.  A 5 mm port was  placed under direct visualization.  Using a probe, the pelvis was inspected, very atrophic appearing pelvis; however, the uterus was floppy  and while the instruments in the vagina was placed for a retroverted uterus, it still was not able to be  manipulated and therefore, I went back and turned the acorn cannula anteriorly, returned to the bedside in a sterile fashion and reinspected the pelvis. Again, it did not make a difference for uterine manipulation. Anterior and posterior cul-de-sac were inspected. No endometriosis noted. prior evidence of C-section scar was noted.  The appendix was noted to be normal and normal tubes and ovaries were noted bilaterally.  The bipolar cautery was then placed and the mid portion of both  fallopian tubes were cauterized. When adequate cauterization was noted on both sides the suprapubic port site was removed under direct visualization.  The abdomen was deflated and the infraumbilical port was removed, taking care not to bring up any underlying  structure.  The rectus fascia was closed with 0 Vicryl figure of eight suture and the skin incision was closed with 4-0 Vicryl subcuticular closures.  The instruments from the vagina was removed.  Tenaculum site small bleeder had silver nitrate cauterization.  SPECIMEN:  None.  ESTIMATED BLOOD LOSS: 94ml.  COMPLICATION: None.  The patient tolerated the procedure well and was transferred to recovery room in stable condition.   SHW D: 09/12/2021 8:54:15 am T: 09/12/2021 10:03:00 am  JOB: [id number]/ [id number]

## 2021-09-12 NOTE — Anesthesia Preprocedure Evaluation (Addendum)
Anesthesia Evaluation  Patient identified by MRN, date of birth, ID band Patient awake    Reviewed: Allergy & Precautions, NPO status , Patient's Chart, lab work & pertinent test results  Airway Mallampati: I  TM Distance: >3 FB Neck ROM: Full    Dental  (+) Teeth Intact, Dental Advisory Given   Pulmonary neg pulmonary ROS,    breath sounds clear to auscultation       Cardiovascular negative cardio ROS   Rhythm:Regular Rate:Normal     Neuro/Psych negative psych ROS   GI/Hepatic negative GI ROS, Neg liver ROS,   Endo/Other  negative endocrine ROS  Renal/GU negative Renal ROS     Musculoskeletal negative musculoskeletal ROS (+)   Abdominal Normal abdominal exam  (+)   Peds  Hematology negative hematology ROS (+)   Anesthesia Other Findings   Reproductive/Obstetrics                            Anesthesia Physical Anesthesia Plan  ASA: 2  Anesthesia Plan: General   Post-op Pain Management:    Induction: Intravenous  PONV Risk Score and Plan: 4 or greater and Ondansetron, Dexamethasone, Midazolam and Scopolamine patch - Pre-op  Airway Management Planned: Oral ETT  Additional Equipment: None  Intra-op Plan:   Post-operative Plan: Extubation in OR  Informed Consent: I have reviewed the patients History and Physical, chart, labs and discussed the procedure including the risks, benefits and alternatives for the proposed anesthesia with the patient or authorized representative who has indicated his/her understanding and acceptance.     Dental advisory given  Plan Discussed with: CRNA  Anesthesia Plan Comments:        Anesthesia Quick Evaluation

## 2021-09-12 NOTE — Brief Op Note (Signed)
09/12/2021  8:58 AM  PATIENT:  Wendy Wu  45 y.o. female  PRE-OPERATIVE DIAGNOSIS:  desires sterilization  POST-OPERATIVE DIAGNOSIS:  desires sterilization  PROCEDURE:  Laparoscopic tubal ligaation with bipolar cautery  SURGEON:  Surgeon(s) and Role:    * Maxie Better, MD - Primary  PHYSICIAN ASSISTANT:   ASSISTANTS: none   ANESTHESIA:   general Findings; nl tubes , ovaries, appendix, liver edge. Small uterus, no evidence of endometriosis EBL:  5 mL   BLOOD ADMINISTERED:none  DRAINS: none   LOCAL MEDICATIONS USED:  MARCAINE     SPECIMEN:  No Specimen  DISPOSITION OF SPECIMEN:  N/A  COUNTS:  YES  TOURNIQUET:  * No tourniquets in log *  DICTATION: .Other Dictation: Dictation Number [id number]  PLAN OF CARE: Discharge to home after PACU  PATIENT DISPOSITION:  PACU - hemodynamically stable.   Delay start of Pharmacological VTE agent (>24hrs) due to surgical blood loss or risk of bleeding: no

## 2021-09-12 NOTE — Interval H&P Note (Signed)
History and Physical Interval Note:  09/12/2021 7:27 AM  Wendy Wu  has presented today for surgery, with the diagnosis of desires sterilization.  The various methods of treatment have been discussed with the patient and family. After consideration of risks, benefits and other options for treatment, the patient has consented to  Procedure(s): LAPAROSCOPIC TUBAL LIGATION (N/A) as a surgical intervention.  The patient's history has been reviewed, patient examined, no change in status, stable for surgery.  I have reviewed the patient's chart and labs.  Questions were answered to the patient's satisfaction.     Clancy Leiner A Aulton Routt

## 2021-09-12 NOTE — Transfer of Care (Signed)
Immediate Anesthesia Transfer of Care Note  Patient: Wendy Wu  Procedure(s) Performed: LAPAROSCOPIC TUBAL LIGATION (Abdomen)  Patient Location: PACU  Anesthesia Type:General  Level of Consciousness: awake, alert , oriented and patient cooperative  Airway & Oxygen Therapy: Patient Spontanous Breathing  Post-op Assessment: Report given to RN and Post -op Vital signs reviewed and stable  Post vital signs: Reviewed and stable  Last Vitals:  Vitals Value Taken Time  BP 112/65 09/12/21 0852  Temp    Pulse 100 09/12/21 0854  Resp 13 09/12/21 0855  SpO2 96 % 09/12/21 0854  Vitals shown include unvalidated device data.  Last Pain:  Vitals:   09/12/21 0609  TempSrc: Oral  PainSc: 4       Patients Stated Pain Goal: 4 (09/12/21 3614)  Complications: No notable events documented.

## 2021-09-12 NOTE — Anesthesia Procedure Notes (Signed)
Procedure Name: Intubation Date/Time: 09/12/2021 7:41 AM Performed by: Rogers Blocker, CRNA Pre-anesthesia Checklist: Patient identified, Emergency Drugs available, Suction available and Patient being monitored Patient Re-evaluated:Patient Re-evaluated prior to induction Oxygen Delivery Method: Circle System Utilized Preoxygenation: Pre-oxygenation with 100% oxygen Induction Type: IV induction Ventilation: Mask ventilation without difficulty Laryngoscope Size: Mac and 3 Grade View: Grade I Tube type: Oral Tube size: 7.0 mm Number of attempts: 1 Airway Equipment and Method: Stylet and Bite block Placement Confirmation: ETT inserted through vocal cords under direct vision, positive ETCO2 and breath sounds checked- equal and bilateral Secured at: 22 cm Tube secured with: Tape Dental Injury: Teeth and Oropharynx as per pre-operative assessment

## 2021-09-12 NOTE — Discharge Instructions (Addendum)
Warm heat to abdomen every 4 hours x 24 hrs. Ambulate. Nothing per vagina for 2 weeks. Call if incisional drainage, redness or increased pain Post Anesthesia Home Care Instructions  Activity: Get plenty of rest for the remainder of the day. A responsible individual must stay with you for 24 hours following the procedure.  For the next 24 hours, DO NOT: -Drive a car -Advertising copywriter -Drink alcoholic beverages -Take any medication unless instructed by your physician -Make any legal decisions or sign important papers.  Meals: Start with liquid foods such as gelatin or soup. Progress to regular foods as tolerated. Avoid greasy, spicy, heavy foods. If nausea and/or vomiting occur, drink only clear liquids until the nausea and/or vomiting subsides. Call your physician if vomiting continues.  Special Instructions/Symptoms: Your throat may feel dry or sore from the anesthesia or the breathing tube placed in your throat during surgery. If this causes discomfort, gargle with warm salt water. The discomfort should disappear within 24 hours.  If you had a scopolamine patch placed behind your ear for the management of post- operative nausea and/or vomiting:  1. The medication in the patch is effective for 72 hours, after which it should be removed.  Wrap patch in a tissue and discard in the trash. Wash hands thoroughly with soap and water. 2. You may remove the patch earlier than 72 hours if you experience unpleasant side effects which may include dry mouth, dizziness or visual disturbances. 3. Avoid touching the patch. Wash your hands with soap and water after contact with the patch.      DISCHARGE INSTRUCTIONS: Laparoscopy  The following instructions have been prepared to help you care for yourself upon your return home today.  Wound care:  Do not get the incision wet for the first 24 hours. The incision should be kept clean and dry.  The Band-Aids or dressings may be removed the day after  surgery.  Should the incision become sore, red, and swollen after the first week, check with your doctor.  Personal hygiene:  Shower the day after your procedure.  Activity and limitations:  Do NOT drive or operate any equipment today.  Do NOT lift anything more than 15 pounds for 2-3 weeks after surgery.  Do NOT rest in bed all day.  Walking is encouraged. Walk each day, starting slowly with 5-minute walks 3 or 4 times a day. Slowly increase the length of your walks.  Walk up and down stairs slowly.  Do NOT do strenuous activities, such as golfing, playing tennis, bowling, running, biking, weight lifting, gardening, mowing, or vacuuming for 2-4 weeks. Ask your doctor when it is okay to start.  Diet: Eat a light meal as desired this evening. You may resume your usual diet tomorrow.  Return to work: This is dependent on the type of work you do. For the most part you can return to a desk job within a week of surgery. If you are more active at work, please discuss this with your doctor.  What to expect after your surgery: You may have a slight burning sensation when you urinate on the first day. You may have a very small amount of blood in the urine. Expect to have a small amount of vaginal discharge/light bleeding for 1-2 weeks. It is not unusual to have abdominal soreness and bruising for up to 2 weeks. You may be tired and need more rest for about 1 week. You may experience shoulder pain for 24-72 hours. Lying flat in bed  may relieve it.  Call your doctor for any of the following:  Develop a fever of 100.4 or greater  Inability to urinate 6 hours after discharge from hospital  Severe pain not relieved by pain medications  Persistent of heavy bleeding at incision site  Redness or swelling around incision site after a week  Increasing nausea or vomiting

## 2021-09-12 NOTE — H&P (Signed)
Wendy Wu is an 45 y.o. female. P2 MF presents for laparoscopic tubal ligation with bipolar cautery due to desired sterilization  Pertinent Gynecological History: Menses: irregular occurring approximately every 60 days without intermenstrual spotting Bleeding: perimenopause Contraception: none DES exposure: denies Blood transfusions: none Sexually transmitted diseases: no past history Previous GYN Procedures: c/s Last mammogram: normal Date: 2022 Last pap: normal Date: 2022 OB History: G2, P2   Menstrual History: Menarche age: n/a Patient's last menstrual period was 05/31/2021 (exact date).    Past Medical History:  Diagnosis Date   COVID-19 10/18/2020   mild fever, cold-like symptoms, mild body aches for 2 days / all symptoms resolved as of 09/09/21 per pt.   Impingement syndrome of right ankle 08/23/2018   Lumbar radiculopathy 11/07/2020   Pt sees Dr. Antoine Primas   Sciatic nerve pain, right 2020   Pt stated that she received an injection into her sciatic nerve in 2021. She stated that it has helped a lot.   Tendonitis of both wrists    Wears glasses     Past Surgical History:  Procedure Laterality Date   CESAREAN SECTION     2001 & 2004    History reviewed. No pertinent family history.  Social History:  reports that she has never smoked. She has never used smokeless tobacco. She reports that she does not currently use alcohol. She reports that she does not use drugs.  Allergies:  Allergies  Allergen Reactions   Other     Reaction to eggplant as a child - rash    No medications prior to admission.    Review of Systems  All other systems reviewed and are negative.  Height 5\' 2"  (1.575 m), weight 72.6 kg, last menstrual period 05/31/2021. Physical Exam Constitutional:      Appearance: Normal appearance.  HENT:     Head: Atraumatic.  Eyes:     Extraocular Movements: Extraocular movements intact.  Cardiovascular:     Rate and Rhythm: Regular  rhythm.     Heart sounds: Normal heart sounds.  Pulmonary:     Breath sounds: Normal breath sounds.  Abdominal:     Palpations: Abdomen is soft.     Comments:  pfannenstiel skin incision.   Genitourinary:    General: Normal vulva.     Comments: Vagina no lesion Cervix closed/long Uterus RF nl Adnexa nl Musculoskeletal:        General: Normal range of motion.     Cervical back: Neck supple.  Neurological:     General: No focal deficit present.     Mental Status: She is alert and oriented to person, place, and time.  Psychiatric:        Mood and Affect: Mood normal.        Behavior: Behavior normal.    No results found for this or any previous visit (from the past 24 hour(s)).  No results found.  Assessment/Plan: Desires sterilization Perimenopause P) LTL with bipolar cautery. Procedure explained. Risk of surgery reviewed including infection, bleeding, injury to surrounding organ structures, permanent , nonreversible, failure rate 1/500=1/600 All ? answered Rakin Lemelle A Vishnu Moeller 09/12/2021, 12:46 AM

## 2021-09-15 ENCOUNTER — Encounter (HOSPITAL_BASED_OUTPATIENT_CLINIC_OR_DEPARTMENT_OTHER): Payer: Self-pay | Admitting: Obstetrics and Gynecology

## 2021-09-16 ENCOUNTER — Emergency Department (INDEPENDENT_AMBULATORY_CARE_PROVIDER_SITE_OTHER)
Admission: RE | Admit: 2021-09-16 | Discharge: 2021-09-16 | Disposition: A | Discharge status: Home / Self Care | Payer: BC Managed Care – PPO | Source: Ambulatory Visit

## 2021-09-16 ENCOUNTER — Other Ambulatory Visit: Payer: Self-pay

## 2021-09-16 VITALS — BP 104/70 | HR 83 | Temp 99.1°F | Resp 15 | Wt 164.0 lb

## 2021-09-16 DIAGNOSIS — J020 Streptococcal pharyngitis: Secondary | ICD-10-CM | POA: Diagnosis not present

## 2021-09-16 DIAGNOSIS — J0100 Acute maxillary sinusitis, unspecified: Secondary | ICD-10-CM

## 2021-09-16 LAB — POC SARS CORONAVIRUS 2 AG -  ED: SARS Coronavirus 2 Ag: NEGATIVE

## 2021-09-16 LAB — POCT INFLUENZA A/B
Influenza A, POC: NEGATIVE
Influenza B, POC: NEGATIVE

## 2021-09-16 LAB — POCT RAPID STREP A (OFFICE): Rapid Strep A Screen: POSITIVE — AB

## 2021-09-16 MED ORDER — PREDNISONE 20 MG PO TABS: ORAL_TABLET | ORAL | 0 refills | Status: DC

## 2021-09-16 MED ORDER — CEFDINIR 300 MG PO CAPS: 300.0000 mg | ORAL_CAPSULE | Freq: Two times a day (BID) | ORAL | 0 refills | Status: AC

## 2021-09-16 NOTE — ED Triage Notes (Signed)
Sore throat since Friday  Body aches since Sunday  Chills at home - no thermometer  Warm salt water gargles  Bilat Tubal ligation on Friday  Scopolamine patch removed on Monday

## 2021-09-16 NOTE — Discharge Instructions (Addendum)
Advised patient to take medication as directed with food to completion.  Advised patient to take Prednisone with first dose of Cefdinir for the next 5 of 7 days.  Encouraged patient to increase daily water intake while taking these medications. °

## 2021-09-16 NOTE — ED Provider Notes (Signed)
[facility]    CSN: [id number] Arrival date & time: 09/16/21  1455      History   Chief Complaint Chief Complaint  Patient presents with   Sore Throat    HPI Wendy Wu is a 45 y.o. female.   HPI 45 year old female presents with sore throat, body aches, chills, for 2 to 3 days.  Patient reports bilateral tubal ligation performed on Friday, 09/12/2021 and scopolamine patch removed on Monday, 09/15/2021.  Past Medical History:  Diagnosis Date   COVID-19 10/18/2020   mild fever, cold-like symptoms, mild body aches for 2 days / all symptoms resolved as of 09/09/21 per pt.   Impingement syndrome of right ankle 08/23/2018   Lumbar radiculopathy 11/07/2020   Pt sees Dr. Antoine Primas   Sciatic nerve pain, right 2020   Pt stated that she received an injection into her sciatic nerve in 2021. She stated that it has helped a lot.   Tendonitis of both wrists    Wears glasses     Patient Active Problem List   Diagnosis Date Noted   Lumbar radiculopathy, right 10/23/2020    Past Surgical History:  Procedure Laterality Date   CESAREAN SECTION     2001 & 2004   LAPAROSCOPIC TUBAL LIGATION N/A 09/12/2021   Procedure: LAPAROSCOPIC TUBAL LIGATION;  Surgeon: Maxie Better, MD;  Location: [facility];  Service: Gynecology;  Laterality: N/A;    OB History   No obstetric history on file.      Home Medications    Prior to Admission medications   Medication Sig Start Date End Date Taking? Authorizing Provider  cefdinir (OMNICEF) 300 MG capsule Take 1 capsule (300 mg total) by mouth 2 (two) times daily for 7 days. 09/16/21 09/23/21 Yes Trevor Iha, FNP  predniSONE (DELTASONE) 20 MG tablet Take 3 tabs PO daily x 5 days. 09/16/21  Yes Trevor Iha, FNP  ibuprofen (ADVIL) 800 MG tablet Take 1 tablet (800 mg total) by mouth every 6 (six) hours as needed for moderate pain. 09/12/21   Maxie Better, MD    Family History Family History   Problem Relation Age of Onset   Diabetes Mother    Hypertension Father    Hyperlipidemia Father    Parkinsonism Father    Dementia Father     Social History Social History   Tobacco Use   Smoking status: Never    Passive exposure: Never   Smokeless tobacco: Never  Vaping Use   Vaping Use: Never used  Substance Use Topics   Alcohol use: Not Currently   Drug use: Never     Allergies   Oseltamivir phosphate and Other   Review of Systems Review of Systems  Constitutional:  Positive for chills.  HENT:  Positive for sore throat.   Musculoskeletal:  Positive for myalgias.  All other systems reviewed and are negative.   Physical Exam Triage Vital Signs ED Triage Vitals  Enc Vitals Group     BP 09/16/21 1514 104/70     Pulse Rate 09/16/21 1514 83     Resp 09/16/21 1514 15     Temp 09/16/21 1514 99.1 F (37.3 C)     Temp Source 09/16/21 1514 Oral     SpO2 09/16/21 1514 97 %     Weight 09/16/21 1518 164 lb (74.4 kg)     Height --      Head Circumference --      Peak Flow --      Pain  Score 09/16/21 1517 7     Pain Loc --      Pain Edu? --      Excl. in GC? --    No data found.  Updated Vital Signs BP 104/70 (BP Location: Left Arm)    Pulse 83    Temp 99.1 F (37.3 C) (Oral)    Resp 15    Wt 164 lb (74.4 kg)    SpO2 97%    BMI 30.00 kg/m   Physical Exam Vitals and nursing note reviewed.  Constitutional:      Appearance: She is well-developed and normal weight.  HENT:     Head: Normocephalic and atraumatic.     Right Ear: Tympanic membrane and ear canal normal.     Left Ear: Tympanic membrane and ear canal normal.     Ears:     Comments: Moderate eustachian tube dysfunction noted bilaterally    Nose:     Right Sinus: Maxillary sinus tenderness present.     Left Sinus: Maxillary sinus tenderness present.     Mouth/Throat:     Mouth: Mucous membranes are moist.     Pharynx: Oropharynx is clear. Uvula midline.     Tonsils: Tonsillar exudate present. 3+  on the right. 3+ on the left.  Eyes:     Conjunctiva/sclera: Conjunctivae normal.     Pupils: Pupils are equal, round, and reactive to light.  Cardiovascular:     Rate and Rhythm: Normal rate and regular rhythm.     Heart sounds: Normal heart sounds.  Pulmonary:     Effort: Pulmonary effort is normal.     Breath sounds: Normal breath sounds.  Musculoskeletal:     Cervical back: Normal range of motion and neck supple.  Skin:    General: Skin is warm and dry.  Neurological:     General: No focal deficit present.     Mental Status: She is alert and oriented to person, place, and time.     UC Treatments / Results  Labs (all labs ordered are listed, but only abnormal results are displayed) Labs Reviewed  POCT RAPID STREP A (OFFICE) - Abnormal; Notable for the following components:      Result Value   Rapid Strep A Screen Positive (*)    All other components within normal limits  POCT INFLUENZA A/B  POC SARS CORONAVIRUS 2 AG -  ED    EKG   Radiology No results found.  Procedures Procedures (including critical care time)  Medications Ordered in UC Medications - No data to display  Initial Impression / Assessment and Plan / UC Course  I have reviewed the triage vital signs and the nursing notes.  Pertinent labs & imaging results that were available during my care of the patient were reviewed by me and considered in my medical decision making (see chart for details).     MDM: 1.  Strep pharyngitis-Rx'd Cefdinir; 2.  Subacute maxillary sinusitis-Rx'd Prednisone burst. Advised patient to take medication as directed with food to completion.  Advised patient to take Prednisone with first dose of Cefdinir for the next 5 of 7 days.  Encouraged patient to increase daily water intake while taking these medications.  Patient discharged home, hemodynamically stable. Final Clinical Impressions(s) / UC Diagnoses   Final diagnoses:  Strep pharyngitis  Subacute maxillary sinusitis      Discharge Instructions      Advised patient to take medication as directed with food to completion.  Advised patient to take  Prednisone with first dose of Cefdinir for the next 5 of 7 days.  Encouraged patient to increase daily water intake while taking these medications.     ED Prescriptions     Medication Sig Dispense Auth. Provider   cefdinir (OMNICEF) 300 MG capsule Take 1 capsule (300 mg total) by mouth 2 (two) times daily for 7 days. 14 capsule Trevor Iha, FNP   predniSONE (DELTASONE) 20 MG tablet Take 3 tabs PO daily x 5 days. 15 tablet Trevor Iha, FNP      PDMP not reviewed this encounter.   Trevor Iha, FNP 09/16/21 [phone]

## 2022-12-16 ENCOUNTER — Encounter: Payer: Self-pay | Admitting: Family Medicine

## 2022-12-16 ENCOUNTER — Ambulatory Visit (INDEPENDENT_AMBULATORY_CARE_PROVIDER_SITE_OTHER): Payer: No Typology Code available for payment source

## 2022-12-16 ENCOUNTER — Ambulatory Visit (INDEPENDENT_AMBULATORY_CARE_PROVIDER_SITE_OTHER): Payer: No Typology Code available for payment source | Admitting: Family Medicine

## 2022-12-16 VITALS — BP 112/80 | HR 73 | Ht 62.0 in | Wt 177.2 lb

## 2022-12-16 DIAGNOSIS — M25551 Pain in right hip: Secondary | ICD-10-CM | POA: Diagnosis not present

## 2022-12-16 DIAGNOSIS — G8929 Other chronic pain: Secondary | ICD-10-CM

## 2022-12-16 DIAGNOSIS — M5441 Lumbago with sciatica, right side: Secondary | ICD-10-CM

## 2022-12-16 DIAGNOSIS — Z8269 Family history of other diseases of the musculoskeletal system and connective tissue: Secondary | ICD-10-CM | POA: Diagnosis not present

## 2022-12-16 LAB — SEDIMENTATION RATE: Sed Rate: 47 mm/hr — ABNORMAL HIGH (ref 0–20)

## 2022-12-16 LAB — CK: Total CK: 84 U/L (ref 7–177)

## 2022-12-16 MED ORDER — TIZANIDINE HCL 2 MG PO TABS: 2.0000 mg | ORAL_TABLET | Freq: Three times a day (TID) | ORAL | 1 refills | Status: AC | PRN

## 2022-12-16 MED ORDER — GABAPENTIN 300 MG PO CAPS: 300.0000 mg | ORAL_CAPSULE | Freq: Three times a day (TID) | ORAL | 3 refills | Status: AC | PRN

## 2022-12-16 MED ORDER — PREDNISONE 50 MG PO TABS: 50.0000 mg | ORAL_TABLET | Freq: Every day | ORAL | 0 refills | Status: AC

## 2022-12-16 NOTE — Patient Instructions (Addendum)
Thank you for coming in today.   Please get an Xray today before you leave   I've referred you to Physical Therapy.  Let us know if you don't hear from them in one week.   Please get labs today before you leave   Recheck with me or Dr Tamala Julian or Dr Glennon Mac in  about 1 month.   Let me know sooner if this is not working.

## 2022-12-16 NOTE — Progress Notes (Signed)
I, Josepha Pigg, CMA acting as a Education administrator for Lynne Leader, MD.  Wendy Wu is a 47 y.o. female who presents to [facility] today for pain in the right hip and groin radiating down the right anterior thigh pain on the low back fever in the left side extending to the left buttocks.  She has had previous lumbar radiculopathy in the past in 2022 at S1 nerve root treated with an epidural steroid injection.  Current pain has been ongoing for about 5 days and is quite severe and interfering with sleep.  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  She has not tried much medications yet.  No bowel or bladder dysfunction.  No weakness or numbness.    Dx imaging: 11/02/20 L-spine MRI  10/23/20 L-spine XR  Pertinent review of systems: No fevers or chills.  Relevant historical information: Striae sciatica. Pertinent family history for ankylosing spondylitis in her sister and Parkinson's disease in her father.   Exam:  BP 112/80   Pulse 73   Ht '5\' 2"'$  (1.575 m)   Wt 177 lb 3.2 oz (80.4 kg)   SpO2 98%   BMI 32.41 kg/m  General: Well Developed, well nourished, and in no acute distress.   MSK:  L-spine: Normal appearing Nontender palpation spinal midline. Tender palpation left lumbar paraspinal musculature and tender palpation of the left side of the sacrum. Decreased lumbar motion. Positive right-sided slump test. Lower extremity strength is intact but resisted right hip flexion does reproduce pain.  Right hip: Normal-appearing Normal hip motion. Hip abduction strength is intact.    Lab and Radiology Results  X-ray images lumbar spine and right hip obtained today personally and independently interpreted  Lumbar spine: Anterior listhesis L4-L5 with some facet DJD at L4-L5 and L5-S1.  DDD L5-S1.  No acute fractures are visible.  Right hip: No acute fractures.  No severe degenerative changes.  No aggressive  appearing bony lesions.  Await formal radiology review   EXAM: MRI LUMBAR SPINE WITHOUT CONTRAST    IMPRESSION: 1. 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 2. L4-5 facet and disc degeneration with left L5 impingement due to a paracentral protrusion.     Electronically Signed   By: Monte Fantasia M.D.   On: 11/02/2020 08:17    Assessment and Plan: 47 y.o. female with acute back and leg pain in the setting of prior history of sciatica.  This is an acute exacerbation.  Right groin pain radiating down the right thigh could be pain from the right hip joint, hip flexors or lumbar radiculopathy at L2 or 3.  Pain in the left sacrum low back area is thought to be muscle spasm and dysfunction.  We discussed options.  Plan for short course of prednisone and trial of gabapentin and tizanidine for immediate pain control.  Will refer to physical therapy.  She lives in [place] and works in [employer] so we will use the [facility] [place] location.  She does have a family history of ankylosing spondylitis so we will proceed with a rheumatologic workup including HLA-B27. Recheck in about a month.    PDMP not reviewed this encounter. Orders Placed This Encounter  Procedures   DG Lumbar Spine 2-3 Views    Standing Status:   Future    Number of Occurrences:   1    Standing Expiration Date:   12/16/2023  Order Specific Question:   Reason for Exam (SYMPTOM  OR DIAGNOSIS REQUIRED)    Answer:   eval low back pain and poss right hip pain    Order Specific Question:   Is patient pregnant?    Answer:   No    Order Specific Question:   Preferred imaging location?    Answer:   [facility]   DG HIP UNILAT WITH PELVIS 2-3 VIEWS RIGHT    Standing Status:   Future    Number of Occurrences:   1    Standing Expiration Date:   12/16/2023    Order Specific Question:   Reason for Exam (SYMPTOM  OR DIAGNOSIS REQUIRED)    Answer:    eval right hip pain    Order Specific Question:   Is patient pregnant?    Answer:   No    Order Specific Question:   Preferred imaging location?    Answer:   [facility]   Sedimentation rate    Standing Status:   Future    Number of Occurrences:   1    Standing Expiration Date:   12/16/2023   Rheumatoid factor    Standing Status:   Future    Number of Occurrences:   1    Standing Expiration Date:   [id number]   Cyclic citrul peptide antibody, IgG    Standing Status:   Future    Number of Occurrences:   1    Standing Expiration Date:   12/16/2023   ANA    Standing Status:   Future    Number of Occurrences:   1    Standing Expiration Date:   12/16/2023   CK    Standing Status:   Future    Number of Occurrences:   1    Standing Expiration Date:   12/16/2023   HLA-B27 antigen    Standing Status:   Future    Number of Occurrences:   1    Standing Expiration Date:   12/16/2023   Ambulatory referral to Physical Therapy    Referral Priority:   Routine    Referral Type:   Physical Medicine    Referral Reason:   Specialty Services Required    Requested Specialty:   Physical Therapy    Number of Visits Requested:   1   Meds ordered this encounter  Medications   predniSONE (DELTASONE) 50 MG tablet    Sig: Take 1 tablet (50 mg total) by mouth daily.    Dispense:  5 tablet    Refill:  0   gabapentin (NEURONTIN) 300 MG capsule    Sig: Take 1 capsule (300 mg total) by mouth 3 (three) times daily as needed.    Dispense:  90 capsule    Refill:  3   tiZANidine (ZANAFLEX) 2 MG tablet    Sig: Take 1-2 tablets (2-4 mg total) by mouth every 8 (eight) hours as needed for muscle spasms.    Dispense:  60 tablet    Refill:  1     Discussed warning signs or symptoms. Please see discharge instructions. Patient expresses understanding.   Marland Kitchen

## 2022-12-20 NOTE — Progress Notes (Signed)
Lumbar spine x-ray shows arthritis changes in the lower part of the lumbar spine.

## 2022-12-20 NOTE — Progress Notes (Signed)
Right hip x-ray looks normal to radiology.  No arthritis is visible.  No fractures are visible.

## 2022-12-21 NOTE — Therapy (Signed)
OUTPATIENT PHYSICAL THERAPY THORACOLUMBAR EVALUATION   Patient Name: Wendy Wu MRN: [id number] DOB:Jul 21, 1976, 47 y.o., female Today's Date: 12/22/2022  END OF SESSION:  PT End of Session - 12/22/22 1408     Visit Number 1    Number of Visits 8    Date for PT Re-Evaluation 02/16/23    PT Start Time [id number]    PT Stop Time 1448    PT Time Calculation (min) 40 min    Activity Tolerance Patient tolerated treatment well    Behavior During Therapy [facility] for tasks assessed/performed             Past Medical History:  Diagnosis Date   COVID-19 10/18/2020   mild fever, cold-like symptoms, mild body aches for 2 days / all symptoms resolved as of 09/09/21 per pt.   Impingement syndrome of right ankle 08/23/2018   Lumbar radiculopathy 11/07/2020   Pt sees Dr. Hulan Saas   Sciatic nerve pain, right 2020   Pt stated that she received an injection into her sciatic nerve in 2021. She stated that it has helped a lot.   Tendonitis of both wrists    Wears glasses    Past Surgical History:  Procedure Laterality Date   CESAREAN SECTION     2001 & 2004   LAPAROSCOPIC TUBAL LIGATION N/A 09/12/2021   Procedure: LAPAROSCOPIC TUBAL LIGATION;  Surgeon: Servando Salina, MD;  Location: [facility];  Service: Gynecology;  Laterality: N/A;   Patient Active Problem List   Diagnosis Date Noted   Lumbar radiculopathy, right 10/23/2020    PCP: Pcp, No   REFERRING PROVIDER: Gregor Hams, MD   REFERRING DIAG:  [id number] (ICD-10-CM) - Right hip pain  M54.41,G89.29 (ICD-10-CM) - Chronic bilateral low back pain with right-sided sciatica  Z82.69 (ICD-10-CM) - Family history of ankylosing spondylitis    Rationale for Evaluation and Treatment: Rehabilitation  THERAPY DIAG:  Radiculopathy, lumbar region  Muscle weakness (generalized)  Cramp and spasm  ONSET DATE: 2 weeks ago, but intermittent for one year  SUBJECTIVE:                                                                                                                                                                                            SUBJECTIVE STATEMENT: Had injection 2 years ago for sciatica. She had a shipment of furniture last week for market and was moving old samples out. Could barely walk last week. Clothing bothers lumbar. She couldn't get out of bed on Saturday and had pain in R ant hip; today feeling it in left ant hip. Saw MD 12/16/22 and couldn't hardly stand  from chair. Prednisone finished. Has been going on for a year or so.  PERTINENT HISTORY:  C -sections  PAIN:  Are you having pain? Yes: NPRS scale: 5/10 Pain location: low back radiating to bil ant hips Pain description: constant Aggravating factors: everything hurts Relieving factors: pain meds  PRECAUTIONS: None  WEIGHT BEARING RESTRICTIONS: No  FALLS:  Has patient fallen in last 6 months? 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 MRI LUMBAR 11/02/20 IMPRESSION: 1. 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 2. L4-5 facet and disc degeneration with left L5 impingement due to a paracentral protrusion.  PATIENT SURVEYS:  FOTO 58 (predicted 45)  SCREENING FOR RED FLAGS: Bowel or bladder incontinence: No Spinal tumors: No Cauda equina syndrome: No Compression fracture: No Abdominal aneurysm: No  COGNITION: Overall cognitive status: Within functional limits for tasks assessed     SENSATION: WFL  MUSCLE LENGTH: HS: mild tightness bil Quads: WNL ITB:NT Piriformis: WNL Hip  Flexors: R mild tightness Heelcords: WNL   POSTURE: increased lumbar lordosis and anterior pelvic tilt  PALPATION: Palpation: TTP at R lumbar, left L gluteals. Increased tissue tension in left gluteals Spinal Mobility: pain at R L4/5 and L3/4  LUMBAR ROM:   AROM eval  Flexion full  Extension full  Right lateral flexion full  Left lateral flexion full  Right rotation 75%  Left rotation 75%   (Blank rows = not tested)  LOWER EXTREMITY ROM:   WNL  LOWER EXTREMITY MMT:  grossly 5/5, weakness in core with MMT  LUMBAR SPECIAL TESTS:  Negative   TODAY'S TREATMENT:                                                                                                                              DATE:   12/22/22 See Pt ed  PATIENT EDUCATION:  Education details: PT eval findings, anticipated POC, initial HEP, postural awareness, and role of DN  Person educated: Patient Education method: Explanation, Demonstration, Tactile cues, Verbal cues, and Handouts Education comprehension: verbalized understanding and returned demonstration  HOME EXERCISE PROGRAM: Access Code: [email or web address] URL: [email or web address] Date: 12/22/2022 Prepared by: Almyra Free  Exercises - Supine Posterior Pelvic Tilt  - 2 x daily - 7 x weekly - 2 sets - 10 reps - 5 sec hold - Seated Pelvic Tilt  - 2 x daily - 7 x weekly - 2 sets - 10 reps - 5 sec hold - Standard Plank  - 2 x daily - 7 x weekly - 1 sets - 5 reps - max hold  ASSESSMENT:  CLINICAL IMPRESSION:  Wendy Wu is a 47 y.o. female who was seen today for physical therapy evaluation and treatment for lumbar radiculopathy beginning two weeks ago. She has a h/o of L4/5 and L5/S1 disc protrusions. Pain is primarily across her low back and into R ant thigh; today also reported left ant thigh pain. She denies N/T. Memori has marked lumbar lordosis and demonstrates abdominal and lumbar weakness with MMT. She has mild flexibility and ROM deficits. Her  pain limits her from lifting and doing her job requirements and prior to prednisone she had pain with s<>s tranfers. She will benefit from skilled PT to address these deficits.    OBJECTIVE IMPAIRMENTS: decreased ROM, decreased strength, hypomobility, increased muscle spasms, impaired flexibility, postural dysfunction, and pain.   ACTIVITY LIMITATIONS: lifting, bending, and transfers  PARTICIPATION LIMITATIONS: occupation  PERSONAL FACTORS: Profession are also affecting patient's functional outcome.   REHAB POTENTIAL: Excellent  CLINICAL DECISION MAKING: Stable/uncomplicated  EVALUATION COMPLEXITY: Low   GOALS: Goals reviewed with patient? Yes  SHORT TERM GOALS: Target date: 01/19/2023   Patient will be independent with initial HEP.  Baseline:  Goal status: INITIAL  2.  Patient will report centralization of radicular symptoms.  Baseline:  Goal status: INITIAL  LONG TERM GOALS: Target date: 02/16/2023    Patient will be independent with advanced/ongoing HEP to improve outcomes and carryover.  Baseline:  Goal status: INITIAL  2.  Patient will report 75% improvement in low back pain to improve QOL and be able to perform job functions.  Baseline:  Goal status: INITIAL  3.  Patient will demonstrate full/functional pain free lumbar ROM to perform ADLs.   Baseline:  Goal status: INITIAL  4.  Patient will demonstrate improved core strength as evidenced by good stabilization with prone and seated hip MMT. Baseline:  Goal status: INITIAL  5.  Patient will report 75 on lumbar FOTO to demonstrate improved functional ability.  Baseline: 58 Goal status: INITIAL   6.  Patient to demonstrate ability to achieve and maintain good spinal alignment/posturing and body mechanics needed for daily activities. Baseline:  Goal status: INITIAL   PLAN:  PT FREQUENCY: 1x/week  PT DURATION: 8 weeks  PLANNED INTERVENTIONS: Therapeutic exercises, Therapeutic activity, Neuromuscular  re-education, Patient/Family education, Self Care, Joint mobilization, Dry Needling, Electrical stimulation, Spinal mobilization, Cryotherapy, Moist heat, Taping, Traction, Ultrasound, and Manual therapy.  PLAN FOR NEXT SESSION: DN/MT to lumbar, focus on abs and lumbar strengthening, body mechanics for job requirements.   Irfan Veal, PT 12/22/2022, 4:00 PM

## 2022-12-22 ENCOUNTER — Other Ambulatory Visit: Payer: Self-pay

## 2022-12-22 ENCOUNTER — Ambulatory Visit: Payer: No Typology Code available for payment source | Attending: Family Medicine | Admitting: Physical Therapy

## 2022-12-22 ENCOUNTER — Encounter: Payer: Self-pay | Admitting: Physical Therapy

## 2022-12-22 DIAGNOSIS — M25551 Pain in right hip: Secondary | ICD-10-CM | POA: Diagnosis not present

## 2022-12-22 DIAGNOSIS — R252 Cramp and spasm: Secondary | ICD-10-CM

## 2022-12-22 DIAGNOSIS — M5416 Radiculopathy, lumbar region: Secondary | ICD-10-CM | POA: Diagnosis not present

## 2022-12-22 DIAGNOSIS — M6281 Muscle weakness (generalized): Secondary | ICD-10-CM | POA: Diagnosis not present

## 2022-12-22 DIAGNOSIS — M5441 Lumbago with sciatica, right side: Secondary | ICD-10-CM | POA: Insufficient documentation

## 2022-12-22 DIAGNOSIS — G8929 Other chronic pain: Secondary | ICD-10-CM | POA: Insufficient documentation

## 2022-12-22 DIAGNOSIS — Z8269 Family history of other diseases of the musculoskeletal system and connective tissue: Secondary | ICD-10-CM | POA: Insufficient documentation

## 2022-12-22 LAB — ANTI-NUCLEAR AB-TITER (ANA TITER)
ANA TITER: 1:40 {titer} — ABNORMAL HIGH
ANA Titer 1: 1:40 {titer} — ABNORMAL HIGH

## 2022-12-22 LAB — HLA-B27 ANTIGEN: HLA-B27 Antigen: POSITIVE — AB

## 2022-12-22 LAB — ANA: Anti Nuclear Antibody (ANA): POSITIVE — AB

## 2022-12-22 LAB — RHEUMATOID FACTOR: Rheumatoid fact SerPl-aCnc: <14 IU/mL (ref <14)

## 2022-12-22 LAB — CYCLIC CITRUL PEPTIDE ANTIBODY, IGG: Cyclic Citrullin Peptide Ab: <16 UNITS

## 2022-12-23 NOTE — Progress Notes (Signed)
Some of the labs were elevated including HLA-B27 which is concerning for ankylosing spondylitis or similar.  The sedimentation rate a marker of inflammation is elevated a little.  ANA is barely positive I do not think that is necessarily much of anything.  I have referred you to a rheumatologist to check into this with more detail.  You should hear from [facility] rheumatology soon.

## 2022-12-23 NOTE — Addendum Note (Signed)
Addended by: Gregor Hams on: 12/23/2022 06:55 AM   Modules accepted: Orders

## 2022-12-29 ENCOUNTER — Encounter: Payer: Self-pay | Admitting: Physical Therapy

## 2022-12-29 ENCOUNTER — Ambulatory Visit: Payer: No Typology Code available for payment source | Admitting: Rehabilitative and Restorative Service Providers"

## 2022-12-29 ENCOUNTER — Ambulatory Visit: Payer: No Typology Code available for payment source | Admitting: Physical Therapy

## 2022-12-29 DIAGNOSIS — M25551 Pain in right hip: Secondary | ICD-10-CM | POA: Diagnosis not present

## 2022-12-29 DIAGNOSIS — M5416 Radiculopathy, lumbar region: Secondary | ICD-10-CM

## 2022-12-29 DIAGNOSIS — M6281 Muscle weakness (generalized): Secondary | ICD-10-CM

## 2022-12-29 DIAGNOSIS — R252 Cramp and spasm: Secondary | ICD-10-CM

## 2022-12-29 NOTE — Therapy (Addendum)
OUTPATIENT PHYSICAL THERAPY TREATMENT AND DISCHARGE  PHYSICAL THERAPY DISCHARGE SUMMARY  Visits from Start of Care: 2  Current functional level related to goals / functional outcomes: See below. Pt has not returned and no-showed to therapy for 2 consecutive visits. Pt is discharged from therapy per attendance policy.    Remaining deficits: See below   Education / Equipment: See below   Patient agrees to discharge. Patient goals were not met. Patient is being discharged due to not returning since the last visit.    Patient Name: Wendy Wu MRN: [id number] DOB:06-13-1976, 47 y.o., female Today's Date: 12/29/2022  END OF SESSION:  PT End of Session - 12/29/22 1623     Visit Number 2    Number of Visits 8    Date for PT Re-Evaluation 02/16/23    PT Start Time 1623   late arrival   PT Stop Time 1700    PT Time Calculation (min) 37 min    Activity Tolerance Patient tolerated treatment well    Behavior During Therapy [facility] for tasks assessed/performed             Past Medical History:  Diagnosis Date   COVID-19 10/18/2020   mild fever, cold-like symptoms, mild body aches for 2 days / all symptoms resolved as of 09/09/21 per pt.   Impingement syndrome of right ankle 08/23/2018   Lumbar radiculopathy 11/07/2020   Pt sees Dr. Antoine Primas   Sciatic nerve pain, right 2020   Pt stated that she received an injection into her sciatic nerve in 2021. She stated that it has helped a lot.   Tendonitis of both wrists    Wears glasses    Past Surgical History:  Procedure Laterality Date   CESAREAN SECTION     2001 & 2004   LAPAROSCOPIC TUBAL LIGATION N/A 09/12/2021   Procedure: LAPAROSCOPIC TUBAL LIGATION;  Surgeon: Maxie Better, MD;  Location: [facility];  Service: Gynecology;  Laterality: N/A;   Patient Active Problem List   Diagnosis Date Noted   Lumbar radiculopathy, right 10/23/2020    PCP: Pcp, No   REFERRING PROVIDER: Rodolph Bong,  MD   REFERRING DIAG:  [phone] (ICD-10-CM) - Right hip pain  M54.41,G89.29 (ICD-10-CM) - Chronic bilateral low back pain with right-sided sciatica  Z82.69 (ICD-10-CM) - Family history of ankylosing spondylitis    Rationale for Evaluation and Treatment: Rehabilitation  THERAPY DIAG:  Radiculopathy, lumbar region  Muscle weakness (generalized)  Cramp and spasm  ONSET DATE: 2 weeks ago, but intermittent for one year  SUBJECTIVE:  SUBJECTIVE STATEMENT: Pt reports she has not been able to do any of her exercises. 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 Reports 10 hours + of work. Pt states she has to be careful with how she bends and carries things.   PERTINENT HISTORY:  C -sections From eval: Had injection 2 years ago for sciatica. She had a shipment of furniture last week for market and was moving old samples out. Could barely walk last week. Clothing bothers lumbar. She couldn't get out of bed on Saturday and had pain in R ant hip; today feeling it in left ant hip. Saw MD 12/16/22 and couldn't hardly stand from chair. Prednisone finished. Has been going on for a year or so.  PAIN:  Are you having pain? Yes: NPRS scale: 3 to 4/10 Pain location: low back radiating to bil ant hips Pain description: constant Aggravating factors: everything hurts Relieving factors: pain meds  PRECAUTIONS: None  WEIGHT BEARING RESTRICTIONS: No  FALLS:  Has patient fallen in last 6 months? 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 MRI LUMBAR 11/02/20 IMPRESSION: 1. 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 2. L4-5 facet and disc degeneration with left L5 impingement due to a paracentral protrusion.  PATIENT SURVEYS:  FOTO 58 (predicted 76)  MUSCLE LENGTH: HS: mild tightness bil Quads: WNL ITB:NT Piriformis: WNL Hip Flexors: R mild tightness Heelcords: WNL   POSTURE: increased lumbar lordosis and anterior pelvic tilt  PALPATION: Palpation: TTP at R lumbar, left L gluteals. Increased tissue tension in left gluteals Spinal Mobility: pain at R L4/5 and L3/4  LUMBAR ROM:   AROM eval  Flexion full  Extension full  Right lateral flexion full  Left lateral flexion full  Right rotation 75%  Left rotation 75%   (Blank rows = not tested)  LOWER EXTREMITY ROM:   WNL  LOWER EXTREMITY MMT:  grossly 5/5, weakness in core with MMT  LUMBAR SPECIAL TESTS:  Negative   TODAY'S TREATMENT:    [facility] Adult PT Treatment:                                                DATE: 12/29/22 Therapeutic Exercise: Nustep L6 x 5 min for LEs Supine LTR 2x30 sec Supine modified thomas stretch 2x30 sec Piriformis stretch 2 x30 sec PPT 10x3 sec PPT + marching 10 x 3 sec Plank 3x20 sec Side plank 2x20 sec Bridge 2x10  DATE:   12/22/22 See Pt ed  PATIENT EDUCATION:  Education details: PT eval findings, anticipated POC, initial HEP, postural awareness, and role of DN  Person educated: Patient Education method: Explanation, Demonstration, Tactile cues, Verbal cues, and Handouts Education comprehension: verbalized understanding and returned demonstration  HOME EXERCISE PROGRAM: Access Code: [email or web address] URL: [email or web address] Date:  12/22/2022 Prepared by: Raynelle Fanning  Exercises - Supine Posterior Pelvic Tilt  - 2 x daily - 7 x weekly - 2 sets - 10 reps - 5 sec hold - Seated Pelvic Tilt  - 2 x daily - 7 x weekly - 2 sets - 10 reps - 5 sec hold - Standard Plank  - 2 x daily - 7 x weekly - 1 sets - 5 reps - max hold  ASSESSMENT:  CLINICAL IMPRESSION: Treatment focused on hip mobility and strengthening. Continued to work on core strengthening.   Wendy Wu is a 47 y.o. female who was seen today for physical therapy evaluation and treatment for lumbar radiculopathy beginning two weeks ago. She has a h/o of L4/5 and L5/S1 disc protrusions. Pain is primarily across her low back and into R ant thigh; today also reported left ant thigh pain. She denies N/T. Shaniyah has marked lumbar lordosis and demonstrates abdominal and lumbar weakness with MMT. She has mild flexibility and ROM deficits. Her pain limits her from lifting and doing her job requirements and prior to prednisone she had pain with s<>s tranfers. She will benefit from skilled PT to address these deficits.    OBJECTIVE IMPAIRMENTS: decreased ROM, decreased strength, hypomobility, increased muscle spasms, impaired flexibility, postural dysfunction, and pain.   ACTIVITY LIMITATIONS: lifting, bending, and transfers  PARTICIPATION LIMITATIONS: occupation  PERSONAL FACTORS: Profession are also affecting patient's functional outcome.   REHAB POTENTIAL: Excellent  CLINICAL DECISION MAKING: Stable/uncomplicated  EVALUATION COMPLEXITY: Low   GOALS: Goals reviewed with patient? Yes  SHORT TERM GOALS: Target date: 01/19/2023   Patient will be independent with initial HEP.  Baseline:  Goal status: INITIAL  2.  Patient will report centralization of radicular symptoms.  Baseline:  Goal status: INITIAL  LONG TERM GOALS: Target date: 02/16/2023    Patient will be independent with advanced/ongoing HEP to improve outcomes and carryover.  Baseline:  Goal status:  INITIAL  2.  Patient will report 75% improvement in low back pain to improve QOL and be able to perform job functions.  Baseline:  Goal status: INITIAL  3.  Patient will demonstrate full/functional pain free lumbar ROM to perform ADLs.   Baseline:  Goal status: INITIAL  4.  Patient will demonstrate improved core strength as evidenced by good stabilization with prone and seated hip MMT. Baseline:  Goal status: INITIAL  5.  Patient will report 82 on lumbar FOTO to demonstrate improved functional ability.  Baseline: 58 Goal status: INITIAL   6.  Patient to demonstrate ability to achieve and maintain good spinal alignment/posturing and body mechanics needed for daily activities. Baseline:  Goal status: INITIAL   PLAN:  PT FREQUENCY: 1x/week  PT DURATION: 8 weeks  PLANNED INTERVENTIONS: Therapeutic exercises, Therapeutic activity, Neuromuscular re-education, Patient/Family education, Self Care, Joint mobilization, Dry Needling, Electrical stimulation, Spinal mobilization, Cryotherapy, Moist heat, Taping, Traction, Ultrasound, and Manual therapy.  PLAN FOR NEXT SESSION: DN/MT to lumbar, focus on abs and lumbar strengthening, body mechanics for job requirements.   Jovonte Commins April Ma L [place], PT 12/29/2022, 4:23 PM

## 2023-01-05 ENCOUNTER — Ambulatory Visit
Payer: No Typology Code available for payment source | Attending: Family Medicine | Admitting: Rehabilitative and Restorative Service Providers"

## 2023-01-05 DIAGNOSIS — G8929 Other chronic pain: Secondary | ICD-10-CM | POA: Insufficient documentation

## 2023-01-05 DIAGNOSIS — M5441 Lumbago with sciatica, right side: Secondary | ICD-10-CM | POA: Insufficient documentation

## 2023-01-05 DIAGNOSIS — M5416 Radiculopathy, lumbar region: Secondary | ICD-10-CM | POA: Insufficient documentation

## 2023-01-05 DIAGNOSIS — Z8269 Family history of other diseases of the musculoskeletal system and connective tissue: Secondary | ICD-10-CM | POA: Insufficient documentation

## 2023-01-05 DIAGNOSIS — M6281 Muscle weakness (generalized): Secondary | ICD-10-CM | POA: Insufficient documentation

## 2023-01-05 DIAGNOSIS — M25551 Pain in right hip: Secondary | ICD-10-CM | POA: Insufficient documentation

## 2023-01-05 NOTE — Progress Notes (Unsigned)
   Shirlyn Goltz, PhD, LAT, ATC acting as a scribe for Lynne Leader, MD.  Jazzalyn Leinberger is a 47 y.o. female who presents to [place] at [facility] today for f/u R hip pain.  Patient was last seen by Dr. Georgina Snell on 12/16/2022, labs/XR were obtained, and he  was prescribed prednisone, gabapentin, tizanidine, and referred to PT.  Based on lab results, patient was referred to [facility] rheumatology.  Today, patient reports***  Dx testing: 12/16/2022 R hip & L-spine x-ray and labs 11/02/20 L-spine MRI             10/23/20 L-spine XR  Pertinent review of systems: ***  Relevant historical information: ***   Exam:  There were no vitals taken for this visit. General: Well Developed, well nourished, and in no acute distress.   MSK: ***    Lab and Radiology Results No results found for this or any previous visit (from the past 72 hour(s)). No results found.     Assessment and Plan: 47 y.o. female with ***   PDMP not reviewed this encounter. No orders of the defined types were placed in this encounter.  No orders of the defined types were placed in this encounter.    Discussed warning signs or symptoms. Please see discharge instructions. Patient expresses understanding.   ***

## 2023-01-06 ENCOUNTER — Ambulatory Visit: Payer: No Typology Code available for payment source | Admitting: Family Medicine

## 2023-01-06 ENCOUNTER — Ambulatory Visit: Payer: BC Managed Care – PPO | Admitting: Family Medicine

## 2023-01-06 ENCOUNTER — Encounter: Payer: Self-pay | Admitting: Family Medicine

## 2023-01-06 VITALS — BP 110/70 | HR 72 | Ht 62.0 in | Wt 178.0 lb

## 2023-01-06 DIAGNOSIS — G8929 Other chronic pain: Secondary | ICD-10-CM

## 2023-01-06 DIAGNOSIS — M5441 Lumbago with sciatica, right side: Secondary | ICD-10-CM

## 2023-01-06 NOTE — Patient Instructions (Addendum)
Thank you for coming in today.   Use the gabapentin or tizanidine as needed at bedtime.   If you get worse I can refill the prednisone but that is not a medicine that you take too much of.   Recheck in 2 months.   Let me know how it goes with Rheumatology.

## 2023-01-21 ENCOUNTER — Telehealth: Payer: Self-pay | Admitting: Physical Therapy

## 2023-01-21 ENCOUNTER — Ambulatory Visit: Payer: No Typology Code available for payment source | Admitting: Physical Therapy

## 2023-01-21 NOTE — Telephone Encounter (Signed)
No-show for today's appointment. 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 Discussed that if she would like to continue with PT, she will need to call the front desk and reschedule, she was agreeable.  Nedra Hai PT DPT PN2

## 2023-02-16 ENCOUNTER — Other Ambulatory Visit: Payer: Self-pay | Admitting: Rheumatology

## 2023-02-16 DIAGNOSIS — M549 Dorsalgia, unspecified: Secondary | ICD-10-CM

## 2023-03-02 ENCOUNTER — Ambulatory Visit
Admission: RE | Admit: 2023-03-02 | Discharge: 2023-03-02 | Disposition: A | Discharge status: Home / Self Care | Payer: No Typology Code available for payment source | Source: Ambulatory Visit | Attending: Rheumatology | Admitting: Rheumatology

## 2023-03-02 DIAGNOSIS — M549 Dorsalgia, unspecified: Secondary | ICD-10-CM

## 2023-03-09 ENCOUNTER — Encounter: Payer: Self-pay | Admitting: Family Medicine

## 2023-03-09 ENCOUNTER — Ambulatory Visit (INDEPENDENT_AMBULATORY_CARE_PROVIDER_SITE_OTHER): Payer: No Typology Code available for payment source | Admitting: Family Medicine

## 2023-03-09 VITALS — BP 100/72 | HR 66 | Ht 62.0 in | Wt 178.0 lb

## 2023-03-09 DIAGNOSIS — M79641 Pain in right hand: Secondary | ICD-10-CM

## 2023-03-09 DIAGNOSIS — M2550 Pain in unspecified joint: Secondary | ICD-10-CM

## 2023-03-09 DIAGNOSIS — M79642 Pain in left hand: Secondary | ICD-10-CM | POA: Diagnosis not present

## 2023-03-09 DIAGNOSIS — Z8269 Family history of other diseases of the musculoskeletal system and connective tissue: Secondary | ICD-10-CM

## 2023-03-09 NOTE — Patient Instructions (Addendum)
Thank you for coming in today.   Plan for hand therapy.   Let me know when you get the MRI results in your mychart.   Recheck in 6 weeks.   We are worried about ankylosing spondylitis.   I've referred you to Occupational Therapy.  Let us know if you don't hear from them in one week.

## 2023-03-09 NOTE — Progress Notes (Signed)
   I, Stevenson Clinch, CMA acting as a scribe for Wendy Graham, MD.  Wendy Wu is a 47 y.o. female who presents to [facility] today for f/u LBP occurring in the setting of positive HLA-B27. Pt was last seen by Dr. Denyse Amass on 01/06/23 and was advised to use the muscle relaxer and gabapentin prn and cont w/ rheumatology visit scheduled for May 1st, who ordered a sacrum MRI, which was completed 1-2 weeks ago but not yet resulted. Today, pt reports some improvement of sx. Not taking muscle relaxer or Gabapentin currently. Was able to participate during furniture market without any major flare-ups. Notes pain in the right heel and stiffness in the right foot with first morning steps, this has been present over the past 2-3 months. Continues to have stiffness in the lower back first thing in the morning. Glutes feel tight after sitting and driving like she did "too may squats". Also notes some swelling in the joints of the fingers/hands. Continues to have pain in the right wrist but has not seen hand specialist yet.   Rheumatology is waiting on the results of the pelvis and sacrum MRI.  Dx testing: 03/02/23 Sacrum MRI 12/16/2022 R hip & L-spine x-ray and labs 11/02/20 L-spine MRI             10/23/20 L-spine XR  Pertinent review of systems: No fevers or chills  Relevant historical information: Family history of inflammatory arthropathy.   Exam:  BP 100/72   Pulse 66   Ht 5\' 2"  (1.575 m)   Wt 178 lb (80.7 kg)   SpO2 98%   BMI 32.56 kg/m  General: Well Developed, well nourished, and in no acute distress.   MSK: Hands mild swelling bilateral fingers and hands.  Decreased right wrist motion. L-spine nontender midline.  Tender palpation bilateral SI joints. Normal lumbar motion.    Lab and Radiology Results  MRI sacrum pelvis obtained on Mar 02, 2023 not yet resulted.  Images personally independent interpreted.  No severe sacroiliitis is visible. No acute  fractures are present.   Assessment and Plan: 47 y.o. female with polyarthralgias with hand pain and swelling low back pain and elevated HLA-B27.  This is concerning for inflammatory arthritis and possibly ankylosing spondylitis.  Await radiology overread MRI.  I did not see sacroiliitis but certainly could have missed it.  Plan refer to OT for hand pain and swelling and await rheumatology follow-up on evaluation. Recheck in 6 weeks.  PDMP not reviewed this encounter. Orders Placed This Encounter  Procedures   Ambulatory referral to Occupational Therapy    Referral Priority:   Routine    Referral Type:   Occupational Therapy    Referral Reason:   Specialty Services Required    Requested Specialty:   Occupational Therapy    Number of Visits Requested:   1   No orders of the defined types were placed in this encounter.    Discussed warning signs or symptoms. Please see discharge instructions. Patient expresses understanding.   The above documentation has been reviewed and is accurate and complete Wendy Wu, M.D.

## 2023-03-10 ENCOUNTER — Telehealth: Payer: Self-pay | Admitting: Family Medicine

## 2023-03-10 DIAGNOSIS — Z8269 Family history of other diseases of the musculoskeletal system and connective tissue: Secondary | ICD-10-CM

## 2023-03-10 DIAGNOSIS — G8929 Other chronic pain: Secondary | ICD-10-CM

## 2023-03-10 NOTE — Telephone Encounter (Signed)
Patient called to let Dr Denyse Amass know that her MRI from Dr Kathi Ludwig has been read. Results below:   FINDINGS: Urinary Tract:  Unremarkable where included   Bowel:  Unremarkable where included   Vascular/Lymphatic: Unremarkable where included   Reproductive:  Retroflexed uterus.  Adnexa unremarkable.   Other:  No supplemental non-categorized findings.   Musculoskeletal: Degenerative facet edema on the right L4-5 with a small synovial cyst below the facet joint. Right paracentral disc protrusion at L5-S1 although smaller than on 11/02/2020. Mild disc bulge and left foraminal disc protrusion at L4-5 believed to result in mild left foraminal stenosis.   No impinging lesion along the sacral plexus or proximal sciatic nerves.   The SI joints appear normal. No significant abnormal edema signal is observed in the sacrum. No presacral edema.   IMPRESSION: 1. Degenerative facet edema on the right at L4-5 with a small synovial cyst below the facet joint. 2. Right paracentral disc protrusion at L5-S1 although smaller than on 11/02/2020. 3. Mild disc bulge and left foraminal disc protrusion at L4-5 believed to result in mild left foraminal stenosis. 4. No specific sacral abnormality or impingement along the sacral plexus. SI joints unremarkable.

## 2023-03-12 NOTE — Telephone Encounter (Signed)
Patient called stating that Dr Kathi Ludwig is wanting her to see a spine specialist. (They are working on finding someone that is in network but she also wanted to get Dr Zollie Pee opinion on the referral and who she should see)  She was wondering if Dr Denyse Amass would be able to talk to Dr Kathi Ludwig about this?  Please advise.

## 2023-03-12 NOTE — Telephone Encounter (Signed)
I called Wendy Wu back and discussed the MRI.

## 2023-03-12 NOTE — Therapy (Signed)
OUTPATIENT OCCUPATIONAL THERAPY ORTHO EVALUATION  Patient Name: Wendy Wu MRN: [id number] DOB:11-Mar-1976, 47 y.o., female Today's Date: 03/12/2023  PCP: N/A REFERRING PROVIDER:  Rodolph Bong, MD    END OF SESSION:   Past Medical History:  Diagnosis Date   COVID-19 10/18/2020   mild fever, cold-like symptoms, mild body aches for 2 days / all symptoms resolved as of 09/09/21 per pt.   Impingement syndrome of right ankle 08/23/2018   Lumbar radiculopathy 11/07/2020   Pt sees Dr. Antoine Primas   Sciatic nerve pain, right 2020   Pt stated that she received an injection into her sciatic nerve in 2021. She stated that it has helped a lot.   Tendonitis of both wrists    Wears glasses    Past Surgical History:  Procedure Laterality Date   CESAREAN SECTION     2001 & 2004   LAPAROSCOPIC TUBAL LIGATION N/A 09/12/2021   Procedure: LAPAROSCOPIC TUBAL LIGATION;  Surgeon: Maxie Better, MD;  Location: [facility];  Service: Gynecology;  Laterality: N/A;   Patient Active Problem List   Diagnosis Date Noted   Lumbar radiculopathy, right 10/23/2020    ONSET DATE: ***  REFERRING DIAG: M79.641,M79.642 (ICD-10-CM) - Pain in both hands   THERAPY DIAG:  No diagnosis found.  Rationale for Evaluation and Treatment: Rehabilitation  SUBJECTIVE:   SUBJECTIVE STATEMENT: She states ***.   Pt accompanied by: {accompnied:27141}  PERTINENT HISTORY: Per MD notes: "refer to OT for hand pain and swelling and await rheumatology follow-up on evaluation. Recheck in 6 weeks."  Also having possible autoimmune issues and back pain/hip pain.   PRECAUTIONS: {Therapy precautions:24002}  WEIGHT BEARING RESTRICTIONS: {Yes ***/No:24003}  PAIN:  Are you having pain? Yes: NPRS scale: ***/10 Pain location: *** Pain description: *** Aggravating factors: *** Relieving factors: ***  FALLS: Has patient fallen in last 6 months? {fallsyesno:27318}  LIVING ENVIRONMENT: Lives  with: {[facility] lives with:25569::"lives with their family"} Lives in: {Lives in:25570} Stairs: {opstairs:27293} Has following equipment at home: {Assistive devices:23999}  PLOF: {PLOF:24004}  PATIENT GOALS: ***  NEXT MD VISIT: ***  OBJECTIVE: (All objective assessments below are from initial evaluation on: *** unless otherwise specified.)    HAND DOMINANCE: Right ***  ADLs: Overall ADLs: States decreased ability to grab, hold household objects, pain and inability to open containers, perform FMS tasks (manipulate fasteners on clothing), mild to moderate bathing problems as well. ***   FUNCTIONAL OUTCOME MEASURES: Eval: Quck DASH ***% impairment today  (Higher % Score  =  More Impairment)    Patient Specific Functional Scale: *** (***, ***, ***)  (Higher Score  =  Better Ability for the Selected Tasks)     Patient Rated Wrist Evaluation (PRWE): Pain: ***/50; Function: ***/50; Total Score: ***/100 (Higher Score  =  More Pain and/or Debility)    UPPER EXTREMITY ROM     Shoulder to Wrist AROM Right eval Left eval  Shoulder flexion    Shoulder abduction    Shoulder extension    Shoulder internal rotation    Shoulder external rotation    Elbow flexion    Elbow extension    Forearm supination    Forearm pronation     Wrist flexion    Wrist extension    Wrist ulnar deviation    Wrist radial deviation    Functional dart thrower's motion (F-DTM) in ulnar flexion    F-DTM in radial extension     (Blank rows = not tested)   Hand AROM Right eval  Left eval  Full Fist Ability (or Gap to Distal Palmar Crease)    Thumb Opposition  (Kapandji Scale)     Thumb MCP (0-60)    Thumb IP (0-80)    Thumb Radial Abduction Span     Thumb Palmar Abduction Span     Index MCP (0-90)     Index PIP (0-100)     Index DIP (0-70)      Long MCP (0-90)      Long PIP (0-100)      Long DIP (0-70)      Ring MCP (0-90)      Ring PIP (0-100)      Ring DIP (0-70)      Little MCP (0-90)       Little PIP (0-100)      Little DIP (0-70)      (Blank rows = not tested)   UPPER EXTREMITY MMT:    Eval: *** NT at eval due to recent and still healing injuries. Will be tested when appropriate.   MMT Right TBD Left TBD  Shoulder flexion    Shoulder abduction    Shoulder adduction    Shoulder extension    Shoulder internal rotation    Shoulder external rotation    Middle trapezius    Lower trapezius    Elbow flexion    Elbow extension    Forearm supination    Forearm pronation    Wrist flexion    Wrist extension    Wrist ulnar deviation    Wrist radial deviation    (Blank rows = not tested)  HAND FUNCTION: Eval: Observed weakness in affected hand.  Grip strength Right: *** lbs, Left: *** lbs   COORDINATION: Eval: Observed coordination impairments with affected hand. Box and Blocks Test: *** Blocks today (*** is [facility]); 9 Hole Peg Test Right: ***sec, Left: *** sec (*** sec is WFL)   SENSATION: Eval: *** Light touch intact today, though diminished around sx area    EDEMA:   Eval: *** Mildly swollen in hand and wrist today, ***cm circumferentially around ***  COGNITION: Eval: Overall cognitive status: WFL for evaluation today ***  OBSERVATIONS:   Eval: ***   TODAY'S TREATMENT:  Post-evaluation treatment: ***    PATIENT EDUCATION: Education details: See tx section above for details  Person educated: Patient Education method: Engineer, structural, Teach back, Handouts  Education comprehension: States and demonstrates understanding, Additional Education required    HOME EXERCISE PROGRAM: See tx section above for details    GOALS: Goals reviewed with patient? Yes   SHORT TERM GOALS: (STG required if POC>30 days) Target Date: ***  Pt will obtain protective, custom orthotic. Goal status: TBD/PRN,  MET ***  2.  Pt will demo/state understanding of initial HEP to improve pain levels and prerequisite motion. Goal status: INITIAL   LONG TERM  GOALS: Target Date: ***  Pt will improve functional ability by decreased impairment per Quick DASH / PSFS / PRWE assessment from *** to *** or better, for better quality of life. Goal status: INITIAL  2.  Pt will improve grip strength in *** hand from ***lbs to at least ***lbs for functional use at home and in IADLs. Goal status: INITIAL  3.  Pt will improve A/ROM in *** from *** to at least ***, to have functional motion for tasks like reach and grasp.  Goal status: INITIAL  4.  Pt will improve strength in *** from *** MMT to at least *** MMT to have  increased functional ability to carry out selfcare and higher-level homecare tasks with no difficulty. Goal status: INITIAL  5.  Pt will improve coordination skills in ***, as seen by better score on *** testing to have increased functional ability to carry out fine motor tasks (fasteners, etc.) and more complex, coordinated IADLs (meal prep, sports, etc.).  Goal status: INITIAL  6.  Pt will decrease pain at worst from ***/10 to ***/10 or better to have better sleep and occupational participation in daily roles. Goal status: INITIAL  ASSESSMENT:  CLINICAL IMPRESSION: Patient is a 47 y.o. female who was seen today for occupational therapy evaluation for ***.   PERFORMANCE DEFICITS: in functional skills including {OT physical skills:25468}, cognitive skills including {OT cognitive skills:25469}, and psychosocial skills including {OT psychosocial skills:25470}.   IMPAIRMENTS: are limiting patient from {OT performance deficits:25471}.   COMORBIDITIES: {Comorbidities:25485} that affects occupational performance. Patient will benefit from skilled OT to address above impairments and improve overall function.  MODIFICATION OR ASSISTANCE TO COMPLETE EVALUATION: {OT modification:25474}  OT OCCUPATIONAL PROFILE AND HISTORY: {OT PROFILE AND HISTORY:25484}  CLINICAL DECISION MAKING: {OT CDM:25475}  REHAB POTENTIAL:  {rehabpotential:25112}  EVALUATION COMPLEXITY: {Evaluation complexity:25115}      PLAN:  OT FREQUENCY: {rehab frequency:25116}  OT DURATION: {rehab duration:25117}  PLANNED INTERVENTIONS: self care/ADL training, therapeutic exercise, therapeutic activity, neuromuscular re-education, manual therapy, passive range of motion, splinting, ultrasound, fluidotherapy, compression bandaging, moist heat, cryotherapy, contrast bath, patient/family education, energy conservation, coping strategies training, DME and/or AE instructions, and Dry needling  RECOMMENDED OTHER SERVICES: none now   CONSULTED AND AGREED WITH PLAN OF CARE: Patient  PLAN FOR NEXT SESSION: ***   Fannie Knee, OT 03/12/2023, 8:33 AM

## 2023-03-15 ENCOUNTER — Encounter: Payer: Self-pay | Admitting: Rehabilitative and Restorative Service Providers"

## 2023-03-15 ENCOUNTER — Other Ambulatory Visit: Payer: Self-pay

## 2023-03-15 ENCOUNTER — Ambulatory Visit: Payer: No Typology Code available for payment source | Admitting: Rehabilitative and Restorative Service Providers"

## 2023-03-15 DIAGNOSIS — M25631 Stiffness of right wrist, not elsewhere classified: Secondary | ICD-10-CM

## 2023-03-15 DIAGNOSIS — M79642 Pain in left hand: Secondary | ICD-10-CM | POA: Diagnosis not present

## 2023-03-15 DIAGNOSIS — R278 Other lack of coordination: Secondary | ICD-10-CM

## 2023-03-15 DIAGNOSIS — M79641 Pain in right hand: Secondary | ICD-10-CM | POA: Diagnosis not present

## 2023-03-15 DIAGNOSIS — M6281 Muscle weakness (generalized): Secondary | ICD-10-CM

## 2023-03-15 DIAGNOSIS — M25531 Pain in right wrist: Secondary | ICD-10-CM

## 2023-03-15 DIAGNOSIS — R202 Paresthesia of skin: Secondary | ICD-10-CM

## 2023-03-16 NOTE — Telephone Encounter (Signed)
Called pt, left VM to call the office.  

## 2023-03-16 NOTE — Telephone Encounter (Signed)
This is something I can do. Recommend come in and talk about the results. I would recommend a lumbar spine MRI to get a better look at the spine. Would you like me to order that first?

## 2023-03-17 NOTE — Telephone Encounter (Signed)
Order placed for MRI L-Spine at [facility].

## 2023-03-17 NOTE — Addendum Note (Signed)
Addended by: Dierdre Searles on: 03/17/2023 12:46 PM   Modules accepted: Orders

## 2023-03-17 NOTE — Telephone Encounter (Signed)
Patient called back.  She would like to proceed with the lumbar spine MRI.

## 2023-03-19 ENCOUNTER — Encounter: Payer: No Typology Code available for payment source | Admitting: Rehabilitative and Restorative Service Providers"

## 2023-03-22 NOTE — Therapy (Signed)
OUTPATIENT OCCUPATIONAL THERAPY TREATMENT NOTE  Patient Name: Wendy Wu MRN: [id number] DOB:25-Jul-1976, 47 y.o., female Today's Date: 03/23/2023  PCP: N/A REFERRING PROVIDER:  Rodolph Bong, MD    END OF SESSION:  OT End of Session - 03/23/23 0850     Visit Number 2    Number of Visits 12    Date for OT Re-Evaluation 04/30/23    Authorization Type [employer]    OT Start Time 0850    OT Stop Time 0936    OT Time Calculation (min) 46 min    Equipment Utilized During Treatment orthotic materials    Activity Tolerance No increased pain;Patient tolerated treatment well;Patient limited by pain    Behavior During Therapy [facility] for tasks assessed/performed              Past Medical History:  Diagnosis Date   COVID-19 10/18/2020   mild fever, cold-like symptoms, mild body aches for 2 days / all symptoms resolved as of 09/09/21 per pt.   Impingement syndrome of right ankle 08/23/2018   Lumbar radiculopathy 11/07/2020   Pt sees Dr. Antoine Primas   Sciatic nerve pain, right 2020   Pt stated that she received an injection into her sciatic nerve in 2021. She stated that it has helped a lot.   Tendonitis of both wrists    Wears glasses    Past Surgical History:  Procedure Laterality Date   CESAREAN SECTION     2001 & 2004   LAPAROSCOPIC TUBAL LIGATION N/A 09/12/2021   Procedure: LAPAROSCOPIC TUBAL LIGATION;  Surgeon: Maxie Better, MD;  Location: [facility];  Service: Gynecology;  Laterality: N/A;   Patient Active Problem List   Diagnosis Date Noted   Lumbar radiculopathy, right 10/23/2020    ONSET DATE: acute on chronic ~ 6 years ago but more acute exacerbations as well   REFERRING DIAG: M79.641,M79.642 (ICD-10-CM) - Pain in both hands   THERAPY DIAG:  Paresthesia of skin  Pain in right hand  Pain in left hand  Other lack of coordination  Muscle weakness (generalized)  Stiffness of right wrist, not elsewhere classified  Pain in right  wrist  Rationale for Evaluation and Treatment: Rehabilitation  PERTINENT HISTORY: Per MD notes: "refer to OT for hand pain and swelling and await rheumatology follow-up on evaluation. Recheck in 6 weeks."  Also having possible autoimmune issues and back pain/hip pain.    Imaging shows: "Patient also underwent an MR arthrogram which was performed on 04/06/2022. This was read as a tear of the distal lamina of the TFCC at the ulnar styloid attachment. The foveal attachment was intact. There was also some tendinosis of the ECU tendon just distal to the ulnar styloid. Patient states that she has been wearing a splint on as-needed basis. She has been using Mobic occasionally but she was developing some GI distress from taking so much anti-inflammatories over the past several months. " She presents very talkative and friendly, recalling events from 5-6 years to bil hands/wrists/hips/etc.  She states about 6 years ago she played volleyball and her Rt wrist snapped back painfully. A few months later the same thing happened on her Lt side. She states several exacerbations over the years.  She had some ulnar sided issues for which she got MRI from hand surgeon. She had cortisone injection and a nerve conduction study as well (normal result). She did have TFCC tear and she is also experiencing new Rt hand MF pain and swelling. She also c/o of  sciatica. 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  Lastly, she discusses having paresthesia in her hands more so in the night and in the early mornings and she does admit to "sleeping on her hands and wrists" often.  PRECAUTIONS: None; WEIGHT BEARING RESTRICTIONS: No  SUBJECTIVE:   SUBJECTIVE STATEMENT: She states still having pain mainly with wrist extension and supination motion and also continues to have left index finger pain and right middle finger stiffness.  She states she will have a new MRI on her back and was  keeping OT informed about this and reminds OT that she also has some neck issues going on.Marland Kitchen     PAIN:  Are you having pain?  Yes: NPRS scale: 1-2/10 at rest now Pain location: Rt ulnar wrist, mainly (less in Rt MF and bil IF MCP Js) Pain description: sharp with some motions, aching Aggravating factors: rotating forearm  Relieving factors: rest    PATIENT GOALS: To improve the symptoms and her right wrist, bilateral hands also work on paresthesia that she has sometimes.    OBJECTIVE: (All objective assessments below are from initial evaluation on: 03/15/23 unless otherwise specified.)   HAND DOMINANCE: Right   ADLs: Overall ADLs: States decreased ability to grab, hold household objects, pain and inability to open containers, perform FMS tasks (manipulate fasteners on clothing), mild to moderate bathing problems as well.    FUNCTIONAL OUTCOME MEASURES: Eval: Quck DASH 44% impairment today  (Higher % Score  =  More Impairment)    UPPER EXTREMITY ROM     Shoulder to Wrist AROM Right eval Left eval  Shoulder flexion    Shoulder abduction    Shoulder extension    Shoulder internal rotation    Shoulder external rotation    Elbow flexion    Elbow extension    Forearm supination 71 85  Forearm pronation  85 85  Wrist flexion 69 73  Wrist extension 54 77  Wrist ulnar deviation 20 46  Wrist radial deviation 24 20  (Blank rows = not tested)   Hand AROM Right eval Left eval  Full Fist Ability (or Gap to Distal Palmar Crease) Full, though tight in MF Full,  c/o some IF pain with fist   Thumb Opposition  (Kapandji Scale)  full full  (Blank rows = not tested)   UPPER EXTREMITY MMT:    Eval:  NT in depth today, as eval was at least moderately complex.  Details TBD. See observations for some stability testing that was done   MMT Right TBD Left TBD  Shoulder flexion    Shoulder abduction    Shoulder adduction    Shoulder extension    Shoulder internal rotation     Shoulder external rotation    Middle trapezius    Lower trapezius    Elbow flexion    Elbow extension    Forearm supination    Forearm pronation    Wrist flexion    Wrist extension    Wrist ulnar deviation    Wrist radial deviation    (Blank rows = not tested)  HAND FUNCTION: Eval: Observed weakness in affected hand.  Grip strength Right: 48 lbs, Left: 61 lbs   COORDINATION: TBD: Box and Blocks Test: Rt: TBD blocks   Eval: Possible mild observed coordination impairments with affected Rt hand due to wrist pain Details TB   SENSATION: Eval:  Light touch intact today, though c/o of  diminished mainly in Rt hand median nerve distribution, more so at night and mornings (occasionally some work tasks)   OBSERVATIONS:   Eval: positive piano key for Rt DRUJ (tender), positive TFCC shear test Rt, tender to palpation at Rt and Lt IF MCPJ, Tender to dorsal Rt MF MCP J and EDU does seem to shift ulnarly from top of MCP J. Rt ECU is also tender along muscle belly as well as extensors in Rt arm as well.   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   TODAY'S TREATMENT:  03/23/23: OT fabricates a soft wrist TFCC support for her and it helps with pain with wrist ext and FA rotation. OT also recommends Wrist Widget, for long-term management.  She was then edu on the following HEP for management of pain, tightness, symptoms (right arm and wrist and TFCC insufficiency) edu for MH use before and ice after if sore.  Only bolded exercises were able to be: Through today as it took some time to educate demonstrate and have patient demonstrate back.  Continue all  Exercises - Forearm Supination Stretch  - 3-4 x daily - 3-5 reps - 15 sec hold - Wrist Flexion Stretch  - 4 x daily - 3-5 reps - 15 sec hold - Wrist Prayer  Stretch  - 4 x daily - 3-5 reps - 15 sec hold - Seated Wrist Ulnar Deviation Stretch  - 3-5 x daily - 3-5 reps - 15 sec hold - Seated Wrist Radial Deviation Stretch  - 3-5 x daily - 3-5 reps - 15 hold - Towel Roll Grip with Forearm in Neutral  - 3 x daily - 5 reps - 10 sec hold - Median Nerve Flossing  - 2-3 x daily - 5-10 reps - BACK KNUCKLE STRETCHES   - 4 x daily - 3-5 reps - 15 sec hold - HOOK Stretch  - 4 x daily - 3-5 reps - 15-20 sec hold - Seated Finger Composite Flexion Stretch  - 4 x daily - 3-5 reps - 15 hold   PATIENT EDUCATION: Education details: See tx section above for details  Person educated: Patient Education method: Verbal Instruction, Teach back, Handouts  Education comprehension: States and demonstrates understanding, Additional Education required    HOME EXERCISE PROGRAM: Access Code: [email or web address] URL: [email or web address] Date: 03/19/2023 Prepared by: Fannie Knee   GOALS: Goals reviewed with patient? Yes   SHORT TERM GOALS: (STG required if POC>30 days) Target Date: 04/02/23  Pt will obtain protective, custom orthotic. Goal status: TBD/PRN  2.  Pt will demo/state understanding of initial HEP to improve pain levels and prerequisite motion. Goal status: INITIAL   LONG TERM GOALS: Target Date: 04/30/23  Pt will improve functional ability by decreased impairment per Quick DASH assessment from 44% to 20% or better, for better quality of life. Goal status: INITIAL  2.  Pt will improve grip strength in Rt dom hand from 48lbs to at least 60lbs for functional use at home and in IADLs. Goal status: INITIAL  3.  Pt will improve A/ROM in Rt FA supination from 71* tender to at least 80* non-tender, to have functional motion for tasks like reach and grasp.  Goal status: INITIAL  4.  Pt will improve strength in Rt wrist flex/ext from TBD/painful MMT to at least 4+/5 MMT to have  increased functional ability to carry out selfcare and higher-level  homecare tasks with no difficulty. Goal status: INITIAL  5.  Pt will improve coordination skills in Rt arm/wrist, as seen by [facility] score on BBT testing to have increased functional ability to carry out fine motor tasks (fasteners, etc.) and more complex, coordinated IADLs (meal prep, sports, etc.).  Goal status: INITIAL- TBD baseline next 1-2 sessions   6.  Pt will decrease pain at worst from 6-7/10 to 3/10 or better to have better sleep and occupational participation in daily roles. Goal status: INITIAL  ASSESSMENT:  CLINICAL IMPRESSION: 03/23/23: New custom wrist strap worked well to limit pain with wrist extension and supination and she was educated on how to wear and when to wear (loosely at rest and tighter with heavy activities as needed).  She is also tolerating exercises well and moist heat seems to help loosen up arthritis and pain as well.  Continue on  Eval: Patient is a 47 y.o. female who was seen today for occupational therapy evaluation for multiple complaints in her upper extremities as well as hips and back, but OT will be focused mainly on chronic right wrist pain as well as bilateral hand arthritis and some likely median nerve impingement of the night.  She will benefit from outpatient occupational therapy to decrease symptoms and increase quality of life.  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    PLAN:  OT FREQUENCY: 2x/week  OT DURATION: 6 weeks  PLANNED INTERVENTIONS: self care/ADL training, therapeutic exercise, therapeutic activity, neuromuscular re-education, manual therapy, passive range of motion, splinting, ultrasound, fluidotherapy, compression bandaging, moist heat, cryotherapy, contrast bath, patient/family education, energy conservation, coping strategies training, DME and/or AE instructions, and Dry needling  CONSULTED AND AGREED WITH PLAN OF CARE: Patient  PLAN FOR NEXT SESSION:   Go over sleep posture and also Lt IF pain,  assign finger stretches to help manage arthritis, discuss nerve glides and prevention of nerve pressure.  Do box and blocks Test if time allows.   Fannie Knee, OT 03/23/2023, 11:03 AM

## 2023-03-23 ENCOUNTER — Encounter: Payer: Self-pay | Admitting: Rehabilitative and Restorative Service Providers"

## 2023-03-23 ENCOUNTER — Ambulatory Visit: Payer: No Typology Code available for payment source | Admitting: Rehabilitative and Restorative Service Providers"

## 2023-03-23 DIAGNOSIS — M79642 Pain in left hand: Secondary | ICD-10-CM

## 2023-03-23 DIAGNOSIS — R202 Paresthesia of skin: Secondary | ICD-10-CM

## 2023-03-23 DIAGNOSIS — R278 Other lack of coordination: Secondary | ICD-10-CM | POA: Diagnosis not present

## 2023-03-23 DIAGNOSIS — M25531 Pain in right wrist: Secondary | ICD-10-CM

## 2023-03-23 DIAGNOSIS — M79641 Pain in right hand: Secondary | ICD-10-CM

## 2023-03-23 DIAGNOSIS — M25631 Stiffness of right wrist, not elsewhere classified: Secondary | ICD-10-CM

## 2023-03-23 DIAGNOSIS — M6281 Muscle weakness (generalized): Secondary | ICD-10-CM

## 2023-03-25 ENCOUNTER — Encounter: Payer: Self-pay | Admitting: Rehabilitative and Restorative Service Providers"

## 2023-03-25 ENCOUNTER — Encounter: Payer: No Typology Code available for payment source | Admitting: Rehabilitative and Restorative Service Providers"

## 2023-03-25 ENCOUNTER — Telehealth: Payer: Self-pay | Admitting: Rehabilitative and Restorative Service Providers"

## 2023-03-25 ENCOUNTER — Ambulatory Visit: Payer: No Typology Code available for payment source | Admitting: Rehabilitative and Restorative Service Providers"

## 2023-03-25 DIAGNOSIS — M79642 Pain in left hand: Secondary | ICD-10-CM | POA: Diagnosis not present

## 2023-03-25 DIAGNOSIS — M79641 Pain in right hand: Secondary | ICD-10-CM

## 2023-03-25 DIAGNOSIS — M25631 Stiffness of right wrist, not elsewhere classified: Secondary | ICD-10-CM

## 2023-03-25 DIAGNOSIS — M25531 Pain in right wrist: Secondary | ICD-10-CM

## 2023-03-25 DIAGNOSIS — R278 Other lack of coordination: Secondary | ICD-10-CM

## 2023-03-25 DIAGNOSIS — R202 Paresthesia of skin: Secondary | ICD-10-CM

## 2023-03-25 DIAGNOSIS — M6281 Muscle weakness (generalized): Secondary | ICD-10-CM

## 2023-03-25 NOTE — Therapy (Signed)
OUTPATIENT OCCUPATIONAL THERAPY TREATMENT NOTE  Patient Name: Wendy Wu MRN: [id number] DOB:[birth year], 47 y.o., female Today's Date: 03/25/2023  PCP: N/A REFERRING PROVIDER:  Rodolph Bong, MD    END OF SESSION:  OT End of Session - 03/25/23 1346     Visit Number 3    Number of Visits 12    Date for OT Re-Evaluation 04/30/23    Authorization Type [employer]    OT Start Time 1346    OT Stop Time 1431    OT Time Calculation (min) 45 min    Equipment Utilized During Treatment --    Activity Tolerance No increased pain;Patient tolerated treatment well;Patient limited by pain    Behavior During Therapy [facility] for tasks assessed/performed              Past Medical History:  Diagnosis Date   COVID-19 10/18/2020   mild fever, cold-like symptoms, mild body aches for 2 days / all symptoms resolved as of 09/09/21 per pt.   Impingement syndrome of right ankle 08/23/2018   Lumbar radiculopathy 11/07/2020   Pt sees Dr. Antoine Primas   Sciatic nerve pain, right 2020   Pt stated that she received an injection into her sciatic nerve in 2021. She stated that it has helped a lot.   Tendonitis of both wrists    Wears glasses    Past Surgical History:  Procedure Laterality Date   CESAREAN SECTION     2001 & 2004   LAPAROSCOPIC TUBAL LIGATION N/A 09/12/2021   Procedure: LAPAROSCOPIC TUBAL LIGATION;  Surgeon: Maxie Better, MD;  Location: [facility];  Service: Gynecology;  Laterality: N/A;   Patient Active Problem List   Diagnosis Date Noted   Lumbar radiculopathy, right 10/23/2020    ONSET DATE: acute on chronic ~ 6 years ago but more acute exacerbations as well   REFERRING DIAG: M79.641,M79.642 (ICD-10-CM) - Pain in both hands   THERAPY DIAG:  Paresthesia of skin  Pain in right hand  Pain in left hand  Other lack of coordination  Muscle weakness (generalized)  Stiffness of right wrist, not elsewhere classified  Pain in right wrist  Rationale for  Evaluation and Treatment: Rehabilitation  PERTINENT HISTORY: Per MD notes: "refer to OT for hand pain and swelling and await rheumatology follow-up on evaluation. Recheck in 6 weeks."  Also having possible autoimmune issues and back pain/hip pain.    Imaging shows: "Patient also underwent an MR arthrogram which was performed on 04/06/2022. This was read as a tear of the distal lamina of the TFCC at the ulnar styloid attachment. The foveal attachment was intact. There was also some tendinosis of the ECU tendon just distal to the ulnar styloid. Patient states that she has been wearing a splint on as-needed basis. She has been using Mobic occasionally but she was developing some GI distress from taking so much anti-inflammatories over the past several months. " She presents very talkative and friendly, recalling events from 5-6 years to bil hands/wrists/hips/etc.  She states about 6 years ago she played volleyball and her Rt wrist snapped back painfully. A few months later the same thing happened on her Lt side. She states several exacerbations over the years.  She had some ulnar sided issues for which she got MRI from hand surgeon. She had cortisone injection and a nerve conduction study as well (normal result). She did have TFCC tear and she is also experiencing new Rt hand MF pain and swelling. She also c/o of sciatica.  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  Lastly, she discusses having paresthesia in her hands more so in the night and in the early mornings and she does admit to "sleeping on her hands and wrists" often.  PRECAUTIONS: None; WEIGHT BEARING RESTRICTIONS: No  SUBJECTIVE:   SUBJECTIVE STATEMENT: She states her soft wrist orthosis is working but she's had difficulty deciding how tight or loose to strap it, etc.    PAIN:  Are you having pain?   Yes: NPRS scale: 1-2/10 at rest now Pain location: Rt ulnar wrist, mainly (less in Rt MF  and bil IF MCP Js) Pain description: sharp with some motions, aching Aggravating factors: rotating forearm  Relieving factors: rest    PATIENT GOALS: To improve the symptoms and her right wrist, bilateral hands also work on paresthesia that she has sometimes.    OBJECTIVE: (All objective assessments below are from initial evaluation on: 03/15/23 unless otherwise specified.)   HAND DOMINANCE: Right   ADLs: Overall ADLs: States decreased ability to grab, hold household objects, pain and inability to open containers, perform FMS tasks (manipulate fasteners on clothing), mild to moderate bathing problems as well.    FUNCTIONAL OUTCOME MEASURES: Eval: Quck DASH 44% impairment today  (Higher % Score  =  More Impairment)    UPPER EXTREMITY ROM     Shoulder to Wrist AROM Right eval Left eval  Shoulder flexion    Shoulder abduction    Shoulder extension    Shoulder internal rotation    Shoulder external rotation    Elbow flexion    Elbow extension    Forearm supination 71 85  Forearm pronation  85 85  Wrist flexion 69 73  Wrist extension 54 77  Wrist ulnar deviation 20 46  Wrist radial deviation 24 20  (Blank rows = not tested)   Hand AROM Right eval Left eval  Full Fist Ability (or Gap to Distal Palmar Crease) Full, though tight in MF Full,  c/o some IF pain with fist   Thumb Opposition  (Kapandji Scale)  full full  (Blank rows = not tested)   UPPER EXTREMITY MMT:    Eval:  NT in depth today, as eval was at least moderately complex.  Details TBD. See observations for some stability testing that was done   MMT Right TBD Left TBD  Shoulder flexion    Shoulder abduction    Shoulder adduction    Shoulder extension    Shoulder internal rotation    Shoulder external rotation    Middle trapezius    Lower trapezius    Elbow flexion    Elbow extension    Forearm supination    Forearm pronation    Wrist flexion    Wrist extension    Wrist ulnar deviation    Wrist  radial deviation    (Blank rows = not tested)  HAND FUNCTION: Eval: Observed weakness in affected hand.  Grip strength Right: 48 lbs, Left: 61 lbs   COORDINATION: TBD: Box and Blocks Test: Rt: TBD blocks   Eval: Possible mild observed coordination impairments with affected Rt hand due to wrist pain Details TB   SENSATION: Eval:  Light touch intact today, though c/o of diminished mainly in Rt hand median nerve distribution, more so at night and mornings (occasionally some work tasks)   OBSERVATIONS:   Eval: positive piano key for Rt DRUJ (tender), positive TFCC shear test Rt, tender  to palpation at Rt and Lt IF MCPJ, Tender to dorsal Rt MF MCP J and EDU does seem to shift ulnarly from top of MCP J. Rt ECU is also tender along muscle belly as well as extensors in Rt arm as well.   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   TODAY'S TREATMENT:  03/25/23: OT reviews use of rest soft orthosis with her and demonstrates and describes how tightly it should be strapped.  She does state that it decreases pain with wrist motion and that this has "fixed" it.  OT advises that this is something that she may need to change from time to time and that there is no "1 size fits all" approach.  Next she lies down to demonstrate sleep postures with which she is having some shoulder pain, excessively bent elbow, bent wrists and hands underneath her face which will cause nerve compression and paresthesia.  She is educated to support her head and neck with a thicker pillow, "hug" a pillow to support her opposite shoulder and to put her hand inside of the pillowcase to help prevent hand coming back out of habit and being laid on.  She states this feels comfortable and that she will try this.  Next, for median nerve paresthesia she  is educated on median nerve glides during when she does not feel much of a tingle but more of a stretch.  Afterwards she does state feeling some increased paresthesia which is normal and should resolve and actually help her symptoms.  Additionally OT does some IASTM with education for self massage near the lateral epicondyle and the extensor wad through the insertion of the ECU on the right arm.  She states this is relieving and helps decrease her pain with motion afterwards.  Lastly, OT educates on hand stretches at the IP joints and composite finger flexion stretches for the management of arthritis and hand stiffness.  She tolerates these well and will add to her home exercise program.  Exercises/Activities   (New Today = Bolded)  - Forearm Supination Stretch  - 3-4 x daily - 3-5 reps - 15 sec hold - Wrist Flexion Stretch  - 4 x daily - 3-5 reps - 15 sec hold - Wrist Prayer Stretch  - 4 x daily - 3-5 reps - 15 sec hold - Seated Wrist Ulnar Deviation Stretch  - 3-5 x daily - 3-5 reps - 15 sec hold - Seated Wrist Radial Deviation Stretch  - 3-5 x daily - 3-5 reps - 15 hold - Towel Roll Grip with Forearm in Neutral  - 3 x daily - 5 reps - 10 sec hold - Median Nerve Flossing  - 2-3 x daily - 5-10 reps - BACK KNUCKLE STRETCHES   - 4 x daily - 3-5 reps - 15 sec hold - HOOK Stretch  - 4 x daily - 3-5 reps - 15-20 sec hold - Seated Finger Composite Flexion Stretch  - 4 x daily - 3-5 reps - 15 hold     03/23/23: OT fabricates a soft wrist TFCC support for her and it helps with pain with wrist ext and FA rotation. OT also recommends Wrist Widget, for long-term management.  She was then edu on the following HEP for management of pain, tightness, symptoms (right arm and wrist and TFCC  insufficiency) edu for MH use before and ice after if sore.  Only bolded exercises were able to be: Through today as it took some time to educate demonstrate and have patient demonstrate back.  Continue all    PATIENT  EDUCATION: Education details: See tx section above for details  Person educated: Patient Education method: Verbal Instruction, Teach back, Handouts  Education comprehension: States and demonstrates understanding, Additional Education required    HOME EXERCISE PROGRAM: Access Code: [email or web address] URL: [email or web address] Date: 03/19/2023 Prepared by: Fannie Knee   GOALS: Goals reviewed with patient? Yes   SHORT TERM GOALS: (STG required if POC>30 days) Target Date: 04/02/23  Pt will obtain protective, custom orthotic. Goal status: TBD/PRN  2.  Pt will demo/state understanding of initial HEP to improve pain levels and prerequisite motion. Goal status: 03/25/23: MET   LONG TERM GOALS: Target Date: 04/30/23  Pt will improve functional ability by decreased impairment per Quick DASH assessment from 44% to 20% or better, for better quality of life. Goal status: INITIAL  2.  Pt will improve grip strength in Rt dom hand from 48lbs to at least 60lbs for functional use at home and in IADLs. Goal status: INITIAL  3.  Pt will improve A/ROM in Rt FA supination from 71* tender to at least 80* non-tender, to have functional motion for tasks like reach and grasp.  Goal status: INITIAL  4.  Pt will improve strength in Rt wrist flex/ext from TBD/painful MMT to at least 4+/5 MMT to have increased functional ability to carry out selfcare and higher-level homecare tasks with no difficulty. Goal status: INITIAL  5.  Pt will improve coordination skills in Rt arm/wrist, as seen by [facility] score on BBT testing to have increased functional ability to carry out fine motor tasks (fasteners, etc.) and more complex, coordinated IADLs (meal prep, sports, etc.).  Goal status: INITIAL- TBD baseline next 1-2 sessions   6.  Pt will decrease pain at worst from 6-7/10 to 3/10 or better to have better sleep and occupational participation in daily roles. Goal status: INITIAL  ASSESSMENT:  CLINICAL  IMPRESSION: 03/25/23: She continues to have little to no pain and struggles with long-term management ideas for chronic TFCC tear and right wrist.  Hand tightness and pain is decreasing and typically not presents during sessions though subjective report states still present at times at home.  Paresthesia has been improving and median nerve not very sensitive today with clotting.  Carry on   PLAN:  OT FREQUENCY: 2x/week  OT DURATION: 6 weeks  PLANNED INTERVENTIONS: self care/ADL training, therapeutic exercise, therapeutic activity, neuromuscular re-education, manual therapy, passive range of motion, splinting, ultrasound, fluidotherapy, compression bandaging, moist heat, cryotherapy, contrast bath, patient/family education, energy conservation, coping strategies training, DME and/or AE instructions, and Dry needling  CONSULTED AND AGREED WITH PLAN OF CARE: Patient  PLAN FOR NEXT SESSION:   Review entire home exercise program, discuss any lingering or remaining deficits, continue with manual therapy modalities as helpful,Do box and blocks Test if time allows.  Fannie Knee, OT 03/25/2023, 5:30 PM

## 2023-03-26 NOTE — Therapy (Signed)
OUTPATIENT OCCUPATIONAL THERAPY TREATMENT NOTE  Patient Name: Wendy Wu MRN: [id number] DOB:Oct 11, 1975, 47 y.o., female Today's Date: 03/29/2023  PCP: N/A REFERRING PROVIDER:  Rodolph Bong, MD    END OF SESSION:  OT End of Session - 03/29/23 0847     Visit Number 4    Number of Visits 12    Date for OT Re-Evaluation 04/30/23    Authorization Type U[employer]    OT Start Time 0847    OT Stop Time 0926    OT Time Calculation (min) 39 min    Activity Tolerance No increased pain;Patient tolerated treatment well;Patient limited by pain    Behavior During Therapy [facility] for tasks assessed/performed               Past Medical History:  Diagnosis Date   COVID-19 10/18/2020   mild fever, cold-like symptoms, mild body aches for 2 days / all symptoms resolved as of 09/09/21 per pt.   Impingement syndrome of right ankle 08/23/2018   Lumbar radiculopathy 11/07/2020   Pt sees Dr. Antoine Primas   Sciatic nerve pain, right 2020   Pt stated that she received an injection into her sciatic nerve in 2021. She stated that it has helped a lot.   Tendonitis of both wrists    Wears glasses    Past Surgical History:  Procedure Laterality Date   CESAREAN SECTION     2001 & 2004   LAPAROSCOPIC TUBAL LIGATION N/A 09/12/2021   Procedure: LAPAROSCOPIC TUBAL LIGATION;  Surgeon: Maxie Better, MD;  Location: [facility];  Service: Gynecology;  Laterality: N/A;   Patient Active Problem List   Diagnosis Date Noted   Lumbar radiculopathy, right 10/23/2020    ONSET DATE: acute on chronic ~ 6 years ago but more acute exacerbations as well   REFERRING DIAG: M79.641,M79.642 (ICD-10-CM) - Pain in both hands   THERAPY DIAG:  Paresthesia of skin  Pain in right hand  Pain in left hand  Other lack of coordination  Muscle weakness (generalized)  Stiffness of right wrist, not elsewhere classified  Pain in right wrist  Rationale for Evaluation and Treatment:  Rehabilitation  PERTINENT HISTORY: Per MD notes: "refer to OT for hand pain and swelling and await rheumatology follow-up on evaluation. Recheck in 6 weeks."  Also having possible autoimmune issues and back pain/hip pain.    Imaging shows: "Patient also underwent an MR arthrogram which was performed on 04/06/2022. This was read as a tear of the distal lamina of the TFCC at the ulnar styloid attachment. The foveal attachment was intact. There was also some tendinosis of the ECU tendon just distal to the ulnar styloid. Patient states that she has been wearing a splint on as-needed basis. She has been using Mobic occasionally but she was developing some GI distress from taking so much anti-inflammatories over the past several months. " She presents very talkative and friendly, recalling events from 5-6 years to bil hands/wrists/hips/etc.  She states about 6 years ago she played volleyball and her Rt wrist snapped back painfully. A few months later the same thing happened on her Lt side. She states several exacerbations over the years.  She had some ulnar sided issues for which she got MRI from hand surgeon. She had cortisone injection and a nerve conduction study as well (normal result). She did have TFCC tear and she is also experiencing new Rt hand MF pain and swelling. She also c/o of sciatica. 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  Lastly, she discusses having paresthesia in her hands more so in the night and in the early mornings and she does admit to "sleeping on her hands and wrists" often.  PRECAUTIONS: None; WEIGHT BEARING RESTRICTIONS: No  SUBJECTIVE:   SUBJECTIVE STATEMENT: She states she had to do a lot of cleaning this past weekend. Has been wearing TFCC strap.  She does complain of ECU tightness today    PAIN:  Are you having pain?   None now at rest Pain location: Rt ulnar wrist, mainly (less in Rt MF and bil IF MCP Js) Pain  description: sharp with some motions, aching Aggravating factors: rotating forearm  Relieving factors: rest    PATIENT GOALS: To improve the symptoms and her right wrist, bilateral hands also work on paresthesia that she has sometimes.    OBJECTIVE: (All objective assessments below are from initial evaluation on: 03/15/23 unless otherwise specified.)   HAND DOMINANCE: Right   ADLs: Overall ADLs: States decreased ability to grab, hold household objects, pain and inability to open containers, perform FMS tasks (manipulate fasteners on clothing), mild to moderate bathing problems as well.    FUNCTIONAL OUTCOME MEASURES: Eval: Quck DASH 44% impairment today  (Higher % Score  =  More Impairment)    UPPER EXTREMITY ROM     Shoulder to Wrist AROM Right eval Left eval Rt 03/29/23  Shoulder flexion     Shoulder abduction     Shoulder extension     Shoulder internal rotation     Shoulder external rotation     Elbow flexion     Elbow extension     Forearm supination 71 85 80  Forearm pronation  85 85 88  Wrist flexion 69 73 55  Wrist extension 54 77 55  Wrist ulnar deviation 20 46 18  Wrist radial deviation 24 20 15   (Blank rows = not tested)   Hand AROM Right eval Left eval  Full Fist Ability (or Gap to Distal Palmar Crease) Full, though tight in MF Full,  c/o some IF pain with fist   Thumb Opposition  (Kapandji Scale)  full full  (Blank rows = not tested)   UPPER EXTREMITY MMT:    Eval:  NT in depth today, as eval was at least moderately complex.  Details TBD. See observations for some stability testing that was done   MMT Right TBD Left TBD  Shoulder flexion    Shoulder abduction    Shoulder adduction    Shoulder extension    Shoulder internal rotation    Shoulder external rotation    Middle trapezius    Lower trapezius    Elbow flexion    Elbow extension    Forearm supination    Forearm pronation    Wrist flexion    Wrist extension    Wrist ulnar  deviation    Wrist radial deviation    (Blank rows = not tested)  HAND FUNCTION: Eval: Observed weakness in affected hand.  Grip strength Right: 48 lbs, Left: 61 lbs   COORDINATION: 03/29/23: Box and Blocks Test: Rt: 67 blocks (68 is WFL)   SENSATION: Eval:  Light touch intact today, though c/o of diminished mainly in Rt hand median nerve distribution, more so at night and mornings (occasionally some work tasks)   OBSERVATIONS:   Eval: positive piano key for Rt DRUJ (tender), positive TFCC shear test Rt, tender to palpation at Rt and  Lt IF MCPJ, Tender to dorsal Rt MF MCP J and EDU does seem to shift ulnarly from top of MCP J. Rt ECU is also tender along muscle belly as well as extensors in Rt arm as well.   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   TODAY'S TREATMENT:  03/29/23: Reviewed stretch program, also heat and self-massage, wear of TFCC strap. Next She does AROM for exercise and new measures showing mixed progress and baseline BBT for fnl activity.   OT then does IASTM along ECU tendon and into Rt triceps area feeling some areas of tightness- her feeling better at the end. All HEP is reviewed and OT does with her manually as well. Finally OT puts K-tape on ECU for support.  She states understanding HEP and how to keep doing at home    PATIENT EDUCATION: Education details: See tx section above for details  Person educated: Patient Education method: Verbal Instruction, Teach back, Handouts  Education comprehension: States and demonstrates understanding, Additional Education required    HOME EXERCISE PROGRAM: Access Code: [email or web address] URL: [email or web address] Date: 03/19/2023 Prepared by: Fannie Knee   GOALS: Goals reviewed with patient? Yes   SHORT TERM GOALS: (STG  required if POC>30 days) Target Date: 04/02/23  Pt will obtain protective, custom orthotic. Goal status: TBD/PRN  2.  Pt will demo/state understanding of initial HEP to improve pain levels and prerequisite motion. Goal status: 03/25/23: MET   LONG TERM GOALS: Target Date: 04/30/23  Pt will improve functional ability by decreased impairment per Quick DASH assessment from 44% to 20% or better, for better quality of life. Goal status: INITIAL  2.  Pt will improve grip strength in Rt dom hand from 48lbs to at least 60lbs for functional use at home and in IADLs. Goal status: INITIAL  3.  Pt will improve A/ROM in Rt FA supination from 71* tender to at least 80* non-tender, to have functional motion for tasks like reach and grasp.  Goal status: INITIAL  4.  Pt will improve strength in Rt wrist flex/ext from TBD/painful MMT to at least 4+/5 MMT to have increased functional ability to carry out selfcare and higher-level homecare tasks with no difficulty. Goal status: INITIAL  5.  Pt will improve coordination skills in Rt arm/wrist, as seen by [facility] score on BBT testing to have increased functional ability to carry out fine motor tasks (fasteners, etc.) and more complex, coordinated IADLs (meal prep, sports, etc.).  Goal status: INITIAL- TBD baseline next 1-2 sessions   6.  Pt will decrease pain at worst from 6-7/10 to 3/10 or better to have better sleep and occupational participation in daily roles. Goal status: INITIAL  ASSESSMENT:  CLINICAL IMPRESSION: 03/29/23: She's doing well, feeling better, needs to continue and also maintain   03/25/23: She continues to have little to no pain and struggles with long-term management ideas for chronic TFCC tear and right wrist.  Hand tightness and pain is decreasing and typically not presents during sessions though subjective report states still present at times at home.  Paresthesia has been improving and median nerve not very sensitive today with  clotting.  Carry on   PLAN:  OT FREQUENCY: 2x/week  OT DURATION: 6 weeks  PLANNED INTERVENTIONS: self care/ADL training, therapeutic exercise, therapeutic activity, neuromuscular re-education, manual therapy, passive range  of motion, splinting, ultrasound, fluidotherapy, compression bandaging, moist heat, cryotherapy, contrast bath, patient/family education, energy conservation, coping strategies training, DME and/or AE instructions, and Dry needling  CONSULTED AND AGREED WITH PLAN OF CARE: Patient  PLAN FOR NEXT SESSION:   Continue on, try DN if needed, check goals, motion as needed and determine need for more therapy.   Fannie Knee, OT 03/29/2023, 9:30 AM

## 2023-03-28 ENCOUNTER — Ambulatory Visit: Payer: No Typology Code available for payment source

## 2023-03-28 DIAGNOSIS — Z8269 Family history of other diseases of the musculoskeletal system and connective tissue: Secondary | ICD-10-CM

## 2023-03-28 DIAGNOSIS — G8929 Other chronic pain: Secondary | ICD-10-CM

## 2023-03-28 DIAGNOSIS — M5441 Lumbago with sciatica, right side: Secondary | ICD-10-CM

## 2023-03-29 ENCOUNTER — Encounter: Payer: Self-pay | Admitting: Rehabilitative and Restorative Service Providers"

## 2023-03-29 ENCOUNTER — Ambulatory Visit: Payer: No Typology Code available for payment source | Admitting: Rehabilitative and Restorative Service Providers"

## 2023-03-29 DIAGNOSIS — M6281 Muscle weakness (generalized): Secondary | ICD-10-CM

## 2023-03-29 DIAGNOSIS — R202 Paresthesia of skin: Secondary | ICD-10-CM

## 2023-03-29 DIAGNOSIS — M79641 Pain in right hand: Secondary | ICD-10-CM

## 2023-03-29 DIAGNOSIS — M79642 Pain in left hand: Secondary | ICD-10-CM | POA: Diagnosis not present

## 2023-03-29 DIAGNOSIS — R278 Other lack of coordination: Secondary | ICD-10-CM | POA: Diagnosis not present

## 2023-03-29 DIAGNOSIS — M25631 Stiffness of right wrist, not elsewhere classified: Secondary | ICD-10-CM

## 2023-03-29 DIAGNOSIS — M25531 Pain in right wrist: Secondary | ICD-10-CM

## 2023-03-31 NOTE — Therapy (Signed)
OUTPATIENT OCCUPATIONAL THERAPY TREATMENT NOTE  Patient Name: Wendy Wu MRN: [id number] DOB:Oct 30, 1975, 47 y.o., female Today's Date: 04/01/2023  PCP: N/A REFERRING PROVIDER:  Rodolph Bong, MD    END OF SESSION:  OT End of Session - 04/01/23 0848     Visit Number 5    Number of Visits 12    Date for OT Re-Evaluation 04/30/23    Authorization Type [employer]    OT Start Time 0848    OT Stop Time 0937    OT Time Calculation (min) 49 min    Equipment Utilized During Treatment orthotic materials    Activity Tolerance No increased pain;Patient tolerated treatment well;Patient limited by pain;Patient limited by fatigue    Behavior During Therapy [facility] for tasks assessed/performed             Past Medical History:  Diagnosis Date   COVID-19 10/18/2020   mild fever, cold-like symptoms, mild body aches for 2 days / all symptoms resolved as of 09/09/21 per pt.   Impingement syndrome of right ankle 08/23/2018   Lumbar radiculopathy 11/07/2020   Pt sees Dr. Antoine Primas   Sciatic nerve pain, right 2020   Pt stated that she received an injection into her sciatic nerve in 2021. She stated that it has helped a lot.   Tendonitis of both wrists    Wears glasses    Past Surgical History:  Procedure Laterality Date   CESAREAN SECTION     2001 & 2004   LAPAROSCOPIC TUBAL LIGATION N/A 09/12/2021   Procedure: LAPAROSCOPIC TUBAL LIGATION;  Surgeon: Maxie Better, MD;  Location: [facility];  Service: Gynecology;  Laterality: N/A;   Patient Active Problem List   Diagnosis Date Noted   Lumbar radiculopathy, right 10/23/2020    ONSET DATE: acute on chronic ~ 6 years ago but more acute exacerbations as well   REFERRING DIAG: M79.641,M79.642 (ICD-10-CM) - Pain in both hands   THERAPY DIAG:  Paresthesia of skin  Pain in right hand  Pain in left hand  Other lack of coordination  Muscle weakness (generalized)  Stiffness of right wrist, not elsewhere  classified  Pain in right wrist  Rationale for Evaluation and Treatment: Rehabilitation  PERTINENT HISTORY: Per MD notes: "refer to OT for hand pain and swelling and await rheumatology follow-up on evaluation. Recheck in 6 weeks."  Also having possible autoimmune issues and back pain/hip pain.    Imaging shows: "Patient also underwent an MR arthrogram which was performed on 04/06/2022. This was read as a tear of the distal lamina of the TFCC at the ulnar styloid attachment. The foveal attachment was intact. There was also some tendinosis of the ECU tendon just distal to the ulnar styloid. Patient states that she has been wearing a splint on as-needed basis. She has been using Mobic occasionally but she was developing some GI distress from taking so much anti-inflammatories over the past several months. " She presents very talkative and friendly, recalling events from 5-6 years to bil hands/wrists/hips/etc.  She states about 6 years ago she played volleyball and her Rt wrist snapped back painfully. A few months later the same thing happened on her Lt side. She states several exacerbations over the years.  She had some ulnar sided issues for which she got MRI from hand surgeon. She had cortisone injection and a nerve conduction study as well (normal result). She did have TFCC tear and she is also experiencing new Rt hand MF pain and swelling. She also  c/o of sciatica. 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  Lastly, she discusses having paresthesia in her hands more so in the night and in the early mornings and she does admit to "sleeping on her hands and wrists" often.  PRECAUTIONS: None; WEIGHT BEARING RESTRICTIONS: No  SUBJECTIVE:   SUBJECTIVE STATEMENT: She states having an exacerbation 2 nights ago, "flicking" her Rt wrist and hurting it.  The next night, she states falling asleep with her phone in Rt hand with wrist painfully cocked-back.  She got her recommended wrist straps in the mail, and brings for education on use.     PAIN:  Are you having pain?  Pain 9/10 now since hurting herself 2 nights ago.  Pain location: Rt ulnar wrist, mainly (less in Rt MF and bil IF MCP Js) Pain description: sharp with some motions, aching Aggravating factors: rotating forearm  Relieving factors: rest    PATIENT GOALS: To improve the symptoms and her right wrist, bilateral hands also work on paresthesia that she has sometimes.    OBJECTIVE: (All objective assessments below are from initial evaluation on: 03/15/23 unless otherwise specified.)   HAND DOMINANCE: Right   ADLs: Overall ADLs: States decreased ability to grab, hold household objects, pain and inability to open containers, perform FMS tasks (manipulate fasteners on clothing), mild to moderate bathing problems as well.    FUNCTIONAL OUTCOME MEASURES: Eval: Quck DASH 44% impairment today  (Higher % Score  =  More Impairment)    UPPER EXTREMITY ROM     Shoulder to Wrist AROM Right eval Left eval Rt 03/29/23  Shoulder flexion     Shoulder abduction     Shoulder extension     Shoulder internal rotation     Shoulder external rotation     Elbow flexion     Elbow extension     Forearm supination 71 85 80  Forearm pronation  85 85 88  Wrist flexion 69 73 55  Wrist extension 54 77 55  Wrist ulnar deviation 20 46 18  Wrist radial deviation 24 20 15   (Blank rows = not tested)   Hand AROM Right eval Left eval  Full Fist Ability (or Gap to Distal Palmar Crease) Full, though tight in MF Full,  c/o some IF pain with fist   Thumb Opposition  (Kapandji Scale)  full full  (Blank rows = not tested)   UPPER EXTREMITY MMT:    Eval:  NT in depth today, as eval was at least moderately complex.  Details TBD. See observations for some stability testing that was done   MMT Right TBD Left TBD  Shoulder flexion    Shoulder abduction    Shoulder adduction    Shoulder  extension    Shoulder internal rotation    Shoulder external rotation    Middle trapezius    Lower trapezius    Elbow flexion    Elbow extension    Forearm supination    Forearm pronation    Wrist flexion    Wrist extension    Wrist ulnar deviation    Wrist radial deviation    (Blank rows = not tested)  HAND FUNCTION: Eval: Observed weakness in affected hand.  Grip strength Right: 48 lbs, Left: 61 lbs   COORDINATION: 03/29/23: Box and Blocks Test: Rt: 67 blocks (68 is WFL)   SENSATION: Eval:  Light touch intact today, though c/o of diminished mainly in Rt  hand median nerve distribution, more so at night and mornings (occasionally some work tasks)   OBSERVATIONS:   Eval: positive piano key for Rt DRUJ (tender), positive TFCC shear test Rt, tender to palpation at Rt and Lt IF MCPJ, Tender to dorsal Rt MF MCP J and EDU does seem to shift ulnarly from top of MCP J. Rt ECU is also tender along muscle belly as well as extensors in Rt arm as well.   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   TODAY'S TREATMENT:  04/01/23: Custom orthotic fabrication was indicated due to pt's need for long-term support in case of exacerbations or need to push/pull, etc at work. Today, OT fabricated custom thermoplastic wrist immobilization orthotic in ulnar gutter fashion for pt today to help rest her exacerbated wrist. It fit well with no areas of pressure, pt states a comfortable fit. Pt was educated on the wearing schedule (only as needed 1-2 days after exacerbations, and proactively if she must move heavy things, also at night PRN), to remove and bring back for adjustments if it is causing any irritation or is not achieving desired function. It will be checked/adjusted in upcoming sessions, as needed. Pt states  understanding.   Additionally to help her manage her pain today, OT performs several manual therapy techniques including IASTM along the extensor muscle bellies and around the ulnar side of the wrist slightly.  She states this does help with pain.  Additionally with her expressed permission after explanation of potential benefits and problems, OT performs manual therapy dry needling modality to her common extensor wad with a blank needle inserted only partly.  She did not get a good muscle twitch out of this but rather states not tolerating it well and her desire to stop.  OT removes the needle after less than 5 seconds and she has no bleeding, bruising, lingering pain.  Lastly again OT reviews home exercise stretches and performs with her nonpainfully after which she states feeling better at the end of the session-no longer on 9/10 pain and happy that she has a protection orthosis.  (She was also educated today on wearing the new wrist widget brace that she purchased online and this fits her well after education.)  OT does briefly discuss self-care including trying to change habits at night since all of her exacerbation seems to be at night recently.  She should not be holding her phone while falling asleep and such.   PATIENT EDUCATION: Education details: See tx section above for details  Person educated: Patient Education method: Verbal Instruction, Teach back, Handouts  Education comprehension: States and demonstrates understanding, Additional Education required    HOME EXERCISE PROGRAM: Access Code: [email or web address] URL: [email or web address] Date: 03/19/2023 Prepared by: Fannie Knee   GOALS: Goals reviewed with patient? Yes   SHORT TERM GOALS: (STG required if POC>30 days) Target Date: 04/02/23  Pt will obtain protective, custom orthotic. Goal status: 04/01/23: MET  2.  Pt will demo/state understanding of initial HEP to improve pain levels and prerequisite motion. Goal  status: 03/25/23: MET   LONG TERM GOALS: Target Date: 04/30/23  Pt will improve functional ability by decreased impairment per Quick DASH assessment from 44% to 20% or better, for better quality of life. Goal status: INITIAL  2.  Pt will improve grip strength in Rt  dom hand from 48lbs to at least 60lbs for functional use at home and in IADLs. Goal status: INITIAL  3.  Pt will improve A/ROM in Rt FA supination from 71* tender to at least 80* non-tender, to have functional motion for tasks like reach and grasp.  Goal status: INITIAL  4.  Pt will improve strength in Rt wrist flex/ext from TBD/painful MMT to at least 4+/5 MMT to have increased functional ability to carry out selfcare and higher-level homecare tasks with no difficulty. Goal status: INITIAL  5.  Pt will improve coordination skills in Rt arm/wrist, as seen by [facility] score on BBT testing to have increased functional ability to carry out fine motor tasks (fasteners, etc.) and more complex, coordinated IADLs (meal prep, sports, etc.).  Goal status: INITIAL- TBD baseline next 1-2 sessions   6.  Pt will decrease pain at worst from 6-7/10 to 3/10 or better to have better sleep and occupational participation in daily roles. Goal status: INITIAL  ASSESSMENT:  CLINICAL IMPRESSION: 04/01/23: She has been having little to no pain and doing well, unfortunately she suffered exacerbation a couple of nights ago.  She also was unsure how to wear her new wrist strap.  OT did discuss what to do for exacerbations, and provided her with a secure immobilization orthosis today that will help with long-term care and management especially during exacerbations.  Lets try to get on track again with decreasing pain and proper management.  03/29/23: She's doing well, feeling better, needs to continue and also maintain    PLAN:  OT FREQUENCY: 2x/week  OT DURATION: 6 weeks  PLANNED INTERVENTIONS: self care/ADL training, therapeutic exercise, therapeutic  activity, neuromuscular re-education, manual therapy, passive range of motion, splinting, ultrasound, fluidotherapy, compression bandaging, moist heat, cryotherapy, contrast bath, patient/family education, energy conservation, coping strategies training, DME and/or AE instructions, and Dry needling  CONSULTED AND AGREED WITH PLAN OF CARE: Patient  PLAN FOR NEXT SESSION:   Check new orthosis as needed, check pain levels and motion to see if exacerbation has resolved and she is back to little to no pain  Fannie Knee, OT 04/01/2023, 11:35 AM

## 2023-04-01 ENCOUNTER — Encounter: Payer: Self-pay | Admitting: Rehabilitative and Restorative Service Providers"

## 2023-04-01 ENCOUNTER — Ambulatory Visit: Payer: No Typology Code available for payment source | Admitting: Rehabilitative and Restorative Service Providers"

## 2023-04-01 DIAGNOSIS — M25531 Pain in right wrist: Secondary | ICD-10-CM

## 2023-04-01 DIAGNOSIS — R278 Other lack of coordination: Secondary | ICD-10-CM | POA: Diagnosis not present

## 2023-04-01 DIAGNOSIS — R202 Paresthesia of skin: Secondary | ICD-10-CM | POA: Diagnosis not present

## 2023-04-01 DIAGNOSIS — M79641 Pain in right hand: Secondary | ICD-10-CM

## 2023-04-01 DIAGNOSIS — M79642 Pain in left hand: Secondary | ICD-10-CM

## 2023-04-01 DIAGNOSIS — M6281 Muscle weakness (generalized): Secondary | ICD-10-CM

## 2023-04-01 DIAGNOSIS — M25631 Stiffness of right wrist, not elsewhere classified: Secondary | ICD-10-CM

## 2023-04-05 NOTE — Progress Notes (Signed)
Lumbar spine MRI shows the potential for nerve to be pinched causing pain down the right leg and arthritis in her back that could be a source of back pain.  Recommend return to clinic to go over the results in full detail.

## 2023-04-12 ENCOUNTER — Encounter: Payer: No Typology Code available for payment source | Admitting: Rehabilitative and Restorative Service Providers"

## 2023-04-14 ENCOUNTER — Encounter: Payer: No Typology Code available for payment source | Admitting: Rehabilitative and Restorative Service Providers"

## 2023-04-14 NOTE — Therapy (Signed)
OUTPATIENT OCCUPATIONAL THERAPY TREATMENT NOTE  Patient Name: Wendy Wu MRN: [id number] DOB:1976/02/16, 47 y.o., female Today's Date: 04/15/2023  PCP: N/A REFERRING PROVIDER:  Rodolph Bong, MD    END OF SESSION:  OT End of Session - 04/15/23 1309     Visit Number 6    Number of Visits 12    Date for OT Re-Evaluation 04/30/23    Authorization Type [employer]    OT Start Time 1309    OT Stop Time 1359    OT Time Calculation (min) 50 min    Equipment Utilized During Treatment --    Activity Tolerance No increased pain;Patient tolerated treatment well;Patient limited by pain;Patient limited by fatigue    Behavior During Therapy [facility] for tasks assessed/performed             Past Medical History:  Diagnosis Date   COVID-19 10/18/2020   mild fever, cold-like symptoms, mild body aches for 2 days / all symptoms resolved as of 09/09/21 per pt.   Impingement syndrome of right ankle 08/23/2018   Lumbar radiculopathy 11/07/2020   Pt sees Dr. Antoine Primas   Sciatic nerve pain, right 2020   Pt stated that she received an injection into her sciatic nerve in 2021. She stated that it has helped a lot.   Tendonitis of both wrists    Wears glasses    Past Surgical History:  Procedure Laterality Date   CESAREAN SECTION     2001 & 2004   LAPAROSCOPIC TUBAL LIGATION N/A 09/12/2021   Procedure: LAPAROSCOPIC TUBAL LIGATION;  Surgeon: Maxie Better, MD;  Location: [facility] [place];  Service: Gynecology;  Laterality: N/A;   Patient Active Problem List   Diagnosis Date Noted   Lumbar radiculopathy, right 10/23/2020    ONSET DATE: acute on chronic ~ 6 years ago but more acute exacerbations as well   REFERRING DIAG: M79.641,M79.642 (ICD-10-CM) - Pain in both hands   THERAPY DIAG:  Paresthesia of skin  Pain in right hand  Pain in left hand  Other lack of coordination  Muscle weakness (generalized)  Stiffness of right wrist, not elsewhere classified  Pain in  right wrist  Rationale for Evaluation and Treatment: Rehabilitation  PERTINENT HISTORY: Per MD notes: "refer to OT for hand pain and swelling and await rheumatology follow-up on evaluation. Recheck in 6 weeks."  Also having possible autoimmune issues and back pain/hip pain.    Imaging shows: "Patient also underwent an MR arthrogram which was performed on 04/06/2022. This was read as a tear of the distal lamina of the TFCC at the ulnar styloid attachment. The foveal attachment was intact. There was also some tendinosis of the ECU tendon just distal to the ulnar styloid. Patient states that she has been wearing a splint on as-needed basis. She has been using Mobic occasionally but she was developing some GI distress from taking so much anti-inflammatories over the past several months. " She presents very talkative and friendly, recalling events from 5-6 years to bil hands/wrists/hips/etc.  She states about 6 years ago she played volleyball and her Rt wrist snapped back painfully. A few months later the same thing happened on her Lt side. She states several exacerbations over the years.  She had some ulnar sided issues for which she got MRI from hand surgeon. She had cortisone injection and a nerve conduction study as well (normal result). She did have TFCC tear and she is also experiencing new Rt hand MF pain and swelling. She also c/o  of sciatica. 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  Lastly, she discusses having paresthesia in her hands more so in the night and in the early mornings and she does admit to "sleeping on her hands and wrists" often.  PRECAUTIONS: None; WEIGHT BEARING RESTRICTIONS: No  SUBJECTIVE:   SUBJECTIVE STATEMENT: She states that the new orthosis did help her and her pain is reduced. She weaned the rigid orthosis and is back to TFCC strap. She is still withholding certain heavier activities. She also  complains about tight  neck in sleep postures.    PAIN:  Are you having pain?   Pain 4/10 now  Pain location: Rt ulnar wrist, mainly (less in Rt MF and bil IF MCP Js) Pain description: sharp with some motions, aching Aggravating factors: rotating forearm  Relieving factors: rest    PATIENT GOALS: To improve the symptoms and her right wrist, bilateral hands also work on paresthesia that she has sometimes.    OBJECTIVE: (All objective assessments below are from initial evaluation on: 03/15/23 unless otherwise specified.)   HAND DOMINANCE: Right   ADLs: Overall ADLs: States decreased ability to grab, hold household objects, pain and inability to open containers, perform FMS tasks (manipulate fasteners on clothing), mild to moderate bathing problems as well.    FUNCTIONAL OUTCOME MEASURES: Eval: Quck DASH 44% impairment today  (Higher % Score  =  More Impairment)    UPPER EXTREMITY ROM     Shoulder to Wrist AROM Right eval Left eval Rt 03/29/23 Rt 04/15/23  Shoulder flexion    175  Shoulder abduction      Shoulder extension    70  Shoulder internal rotation    29  Shoulder external rotation    75  Elbow flexion      Elbow extension      Forearm supination 71 85 80   Forearm pronation  85 85 88   Wrist flexion 69 73 55   Wrist extension 54 77 55   Wrist ulnar deviation 20 46 18   Wrist radial deviation 24 20 15    (Blank rows = not tested)   Hand AROM Right eval Left eval  Full Fist Ability (or Gap to Distal Palmar Crease) Full, though tight in MF Full,  c/o some IF pain with fist   Thumb Opposition  (Kapandji Scale)  full full  (Blank rows = not tested)   UPPER EXTREMITY MMT:    Eval:  NT in depth today, as eval was at least moderately complex.  Details TBD. See observations for some stability testing that was done   MMT Right TBD Left TBD  Shoulder flexion    Shoulder abduction    Shoulder adduction    Shoulder extension    Shoulder internal rotation    Shoulder external  rotation    Middle trapezius    Lower trapezius    Elbow flexion    Elbow extension    Forearm supination    Forearm pronation    Wrist flexion    Wrist extension    Wrist ulnar deviation    Wrist radial deviation    (Blank rows = not tested)  HAND FUNCTION: Eval: Observed weakness in affected hand.  Grip strength Right: 48 lbs, Left: 61 lbs   COORDINATION: 03/29/23: Box and Blocks Test: Rt: 67 blocks (68 is WFL)   SENSATION: Eval:  Light touch intact today, though c/o of diminished mainly  in Rt hand median nerve distribution, more so at night and mornings (occasionally some work tasks)   OBSERVATIONS:   Eval: positive piano key for Rt DRUJ (tender), positive TFCC shear test Rt, tender to palpation at Rt and Lt IF MCPJ, Tender to dorsal Rt MF MCP J and EDU does seem to shift ulnarly from top of MCP J. Rt ECU is also tender along muscle belly as well as extensors in Rt arm as well.   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   TODAY'S TREATMENT:  04/15/23:  She states that when she sleeps on her pillow, she turns her head to the left.  This is a habit. She now notices that she can't turn her head to her Rt comfortably.  OT explains that this is likely habit based and sleeping in mainly one position for many years does eventually cause tightness and limit the other direction.  However OT works on shoulder and neck tightness today assigning the highlighted stretches below which she states are helpful for shoulder internal rotation tightness as well as upper trap/neck tightness.  Additionally to help her work on stability of the right wrist in the presence of TFCC chronic tear, she does isometric pronation exercises followed by isometric towel gripping exercises.  She can perform these without any pain  today and states feeling of pressure or a stretch sensation.  She was encouraged to do these things often and not stop any of her wrist stretches as well.    Exercises - Forearm Supination Stretch  - 3-4 x daily - 3-5 reps - 15 sec hold - Wrist Flexion Stretch  - 4 x daily - 3-5 reps - 15 sec hold - Wrist Prayer Stretch  - 4 x daily - 3-5 reps - 15 sec hold - Seated Wrist Ulnar Deviation Stretch  - 3-5 x daily - 3-5 reps - 15 sec hold - Seated Wrist Radial Deviation Stretch  - 3-5 x daily - 3-5 reps - 15 hold - Towel Roll Grip with Forearm in Neutral  - 3 x daily - 5 reps - 10 sec hold - Median Nerve Flossing  - 2-3 x daily - 5-10 reps - BACK KNUCKLE STRETCHES   - 4 x daily - 3-5 reps - 15 sec hold - HOOK Stretch  - 4 x daily - 3-5 reps - 15-20 sec hold - Seated Finger Composite Flexion Stretch  - 4 x daily - 3-5 reps - 15 hold - Standing neck/upper traps stretch  - 4-6 x daily - 3-5 reps - 15 sec hold - Sleeper Stretch  - 3-4 x daily - 5 reps - 15-20 sec hold - Hammer Stretch or Strength   - 2-4 x daily - 1-2 sets - 5-10 reps - 5-10 sec hold   PATIENT EDUCATION: Education details: See tx section above for details  Person educated: Patient Education method: Verbal Instruction, Teach back, Handouts  Education comprehension: States and demonstrates understanding, Additional Education required    HOME EXERCISE PROGRAM: Access Code: [email or web address] URL: [email or web address] Date: 03/19/2023 Prepared by: Fannie Knee   GOALS: Goals reviewed with patient? Yes   SHORT TERM GOALS: (STG required if POC>30 days) Target Date: 04/02/23  Pt will obtain protective, custom orthotic. Goal status: 04/01/23: MET  2.  Pt will demo/state understanding of initial HEP  to improve pain levels and prerequisite motion. Goal status: 03/25/23: MET   LONG TERM GOALS: Target Date: 04/30/23  Pt will improve functional ability by decreased impairment per Quick DASH assessment from 44% to 20%  or better, for better quality of life. Goal status: INITIAL  2.  Pt will improve grip strength in Rt dom hand from 48lbs to at least 60lbs for functional use at home and in IADLs. Goal status: INITIAL  3.  Pt will improve A/ROM in Rt FA supination from 71* tender to at least 80* non-tender, to have functional motion for tasks like reach and grasp.  Goal status: INITIAL  4.  Pt will improve strength in Rt wrist flex/ext from TBD/painful MMT to at least 4+/5 MMT to have increased functional ability to carry out selfcare and higher-level homecare tasks with no difficulty. Goal status: INITIAL  5.  Pt will improve coordination skills in Rt arm/wrist, as seen by [facility] score on BBT testing to have increased functional ability to carry out fine motor tasks (fasteners, etc.) and more complex, coordinated IADLs (meal prep, sports, etc.).  Goal status: INITIAL- TBD baseline next 1-2 sessions   6.  Pt will decrease pain at worst from 6-7/10 to 3/10 or better to have better sleep and occupational participation in daily roles. Goal status: INITIAL  ASSESSMENT:  CLINICAL IMPRESSION: 04/15/23: OT will continue to encourage remaining loose and long-term management of neck/shoulder/arm and wrist pain and tightness while adding progressive resistive exercises as tolerated to support functional strength and ability.  This will need done cautiously as she is having some lumbar and hip radiculopathy issues concurrently and is being seen for that as well.    PLAN:  OT FREQUENCY: 2x/week  OT DURATION: 6 weeks  PLANNED INTERVENTIONS: self care/ADL training, therapeutic exercise, therapeutic activity, neuromuscular re-education, manual therapy, passive range of motion, splinting, ultrasound, fluidotherapy, compression bandaging, moist heat, cryotherapy, contrast bath, patient/family education, energy conservation, coping strategies training, DME and/or AE instructions, and Dry needling  CONSULTED AND AGREED  WITH PLAN OF CARE: Patient  PLAN FOR NEXT SESSION:   Check on new shoulder neck stretches as well as checking wrist range of motion and new strengthening of the hand and pronation.  Consider doorway stretches as well as external rotation strengthening as well as wrist strengthening as tolerated.  Fannie Knee, OTR/L 04/15/2023, 2:28 PM

## 2023-04-15 ENCOUNTER — Encounter: Payer: Self-pay | Admitting: Rehabilitative and Restorative Service Providers"

## 2023-04-15 ENCOUNTER — Ambulatory Visit: Payer: No Typology Code available for payment source | Admitting: Rehabilitative and Restorative Service Providers"

## 2023-04-15 DIAGNOSIS — R202 Paresthesia of skin: Secondary | ICD-10-CM | POA: Diagnosis not present

## 2023-04-15 DIAGNOSIS — M25531 Pain in right wrist: Secondary | ICD-10-CM

## 2023-04-15 DIAGNOSIS — M79642 Pain in left hand: Secondary | ICD-10-CM | POA: Diagnosis not present

## 2023-04-15 DIAGNOSIS — R278 Other lack of coordination: Secondary | ICD-10-CM | POA: Diagnosis not present

## 2023-04-15 DIAGNOSIS — M79641 Pain in right hand: Secondary | ICD-10-CM

## 2023-04-15 DIAGNOSIS — M6281 Muscle weakness (generalized): Secondary | ICD-10-CM

## 2023-04-15 DIAGNOSIS — M25631 Stiffness of right wrist, not elsewhere classified: Secondary | ICD-10-CM

## 2023-04-20 NOTE — Progress Notes (Unsigned)
[facility] [address] [place] [address] Phone: [phone] Subjective:   Bruce Donath, am serving as a scribe for Dr. Antoine Primas.  I'm seeing this patient by the request  of:  Pcp, No  CC:   [id number]  Wendy Wu is a 47 y.o. female coming in with complaint of LBP. Last seen in 2022 and she also had epidural in 2022. Has been seeing Dr. Denyse Amass recently for LBP. Patient has been having R groin pain and L sided lower back pain since February due to an increase in work activity and has to work in cold temperatures which causes her to be more tense. Continues to have issues with sciatic nerve pain in R leg and heel but not as bad as she did prior to epidural in 2022. Does not want pain to progress now on L side. Pain worsens after she has been seated as well. Is doing PT.   Has been seeing rheumatology as sister has issue with her back. Father also had lower back pain.   Also was told that she is perimenopausal by another provider which she feels is contributing to her pain.   Seeing another doc for her hand and elbow pain and is doing PT for wrist. Has custom splint that was made for her to wear at night.   MRI lumbar June 2024 IMPRESSION: 1. At L5-S1, decreased size of a now moderate right paracentral disc protrusion with disc contacting and posteriorly displacing the descending right S1 nerve root. 2. 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  Fairly significant facet arthropathy noted.     Past Medical History:  Diagnosis Date   COVID-19 10/18/2020   mild fever, cold-like symptoms, mild body aches for 2 days / all symptoms resolved as of 09/09/21 per pt.   Impingement syndrome of right ankle 08/23/2018   Lumbar radiculopathy 11/07/2020   Pt sees Dr. Antoine Primas   Sciatic nerve pain, right 2020   Pt stated that she received an injection into her sciatic nerve in 2021. She stated that it has  helped a lot.   Tendonitis of both wrists    Wears glasses    Past Surgical History:  Procedure Laterality Date   CESAREAN SECTION     2001 & 2004   LAPAROSCOPIC TUBAL LIGATION N/A 09/12/2021   Procedure: LAPAROSCOPIC TUBAL LIGATION;  Surgeon: Maxie Better, MD;  Location: [facility] [place];  Service: Gynecology;  Laterality: N/A;   Social History   Socioeconomic History   Marital status: Married    Spouse name: Not on file   Number of children: Not on file   Years of education: Not on file   Highest education level: Not on file  Occupational History   Not on file  Tobacco Use   Smoking status: Never    Passive exposure: Never   Smokeless tobacco: Never  Vaping Use   Vaping status: Never Used  Substance and Sexual Activity   Alcohol use: Not Currently   Drug use: Never   Sexual activity: Not on file  Other Topics Concern   Not on file  Social History Narrative   ** Merged History Encounter **       Social Determinants of Health   Financial Resource Strain: Not on file  Food Insecurity: Not on file  Transportation Needs: Not on file  Physical Activity: Not on file  Stress: Not on file  Social Connections: Unknown (02/14/2022)  Received from [employer]   Social Network    Social Network: Not on file   Allergies  Allergen Reactions   Oseltamivir Phosphate Nausea And Vomiting    Other reaction(s): Fever (intolerance), Myalgias (intolerance)   Other     Reaction to eggplant as a child - rash   Family History  Problem Relation Age of Onset   Diabetes Mother    Hypertension Father    Hyperlipidemia Father    Parkinsonism Father    Dementia Father     Current Outpatient Medications (Endocrine & Metabolic):    predniSONE (DELTASONE) 50 MG tablet, Take 1 tablet (50 mg total) by mouth daily.    Current Outpatient Medications (Analgesics):    ibuprofen (ADVIL) 800 MG tablet, Take 1 tablet (800 mg total) by mouth every 6 (six) hours as needed  for moderate pain.   Current Outpatient Medications (Other):    B Complex-Folic Acid (B COMPLEX VITAMINS, W/ FA,) CAPS, Take 1 capsule by mouth daily.   ERGOCALCIFEROL PO, Take by mouth.   gabapentin (NEURONTIN) 300 MG capsule, Take 1 capsule (300 mg total) by mouth 3 (three) times daily as needed.   Multiple Vitamin (MULTIVITAMIN) capsule, Take 1 capsule by mouth daily.   tiZANidine (ZANAFLEX) 2 MG tablet, Take 1-2 tablets (2-4 mg total) by mouth every 8 (eight) hours as needed for muscle spasms.   Reviewed prior external information including notes and imaging from  primary care provider As well as notes that were available from care everywhere and other healthcare systems.  Past medical history, social, surgical and family history all reviewed in electronic medical record.  No pertanent information unless stated regarding to the chief complaint.   Review of Systems:  No headache, visual changes, nausea, vomiting, diarrhea, constipation, dizziness, abdominal pain, skin rash, fevers, chills, night sweats, weight loss, swollen lymph nodes, body aches, joint swelling, chest pain, shortness of breath, mood changes. POSITIVE muscle aches  Objective  Blood pressure 110/68, pulse 68, height 5\' 2"  (1.575 m), weight 178 lb (80.7 kg), SpO2 98%.   General: No apparent distress alert and oriented x3 mood and affect normal, dressed appropriately.  HEENT: Pupils equal, extraocular movements intact  Respiratory: Patient's speak in full sentences and does not appear short of breath  Cardiovascular: No lower extremity edema, non tender, no erythema  Low back exam does have some loss lordosis noted.  Worsening pain with extension of the back noted.  Seems to be mostly in the L4-L5 area.  Patient does have some tightness noted in the paraspinal musculature.  Neurovascular intact distally and appears to have tightness with straight leg test but no true radicular symptoms.    Impression and  Recommendations:    The above documentation has been reviewed and is accurate and complete Judi Saa, DO

## 2023-04-20 NOTE — Therapy (Signed)
OUTPATIENT OCCUPATIONAL THERAPY TREATMENT NOTE  Patient Name: Wendy Wu MRN: [id number] DOB:09-14-1976, 47 y.o., female Today's Date: 04/22/2023  PCP: N/A REFERRING PROVIDER:  Rodolph Bong, MD    END OF SESSION:  OT End of Session - 04/22/23 0931     Visit Number 7    Number of Visits 12    Date for OT Re-Evaluation 04/30/23    Authorization Type [employer]    OT Start Time 0931    OT Stop Time 1020    OT Time Calculation (min) 49 min    Equipment Utilized During Treatment yellow t-bands    Activity Tolerance No increased pain;Patient tolerated treatment well    Behavior During Therapy [facility] for tasks assessed/performed              Past Medical History:  Diagnosis Date   COVID-19 10/18/2020   mild fever, cold-like symptoms, mild body aches for 2 days / all symptoms resolved as of 09/09/21 per pt.   Impingement syndrome of right ankle 08/23/2018   Lumbar radiculopathy 11/07/2020   Pt sees Dr. Antoine Primas   Sciatic nerve pain, right 2020   Pt stated that she received an injection into her sciatic nerve in 2021. She stated that it has helped a lot.   Tendonitis of both wrists    Wears glasses    Past Surgical History:  Procedure Laterality Date   CESAREAN SECTION     2001 & 2004   LAPAROSCOPIC TUBAL LIGATION N/A 09/12/2021   Procedure: LAPAROSCOPIC TUBAL LIGATION;  Surgeon: Maxie Better, MD;  Location: [facility];  Service: Gynecology;  Laterality: N/A;   Patient Active Problem List   Diagnosis Date Noted   Lumbar radiculopathy, right 10/23/2020    ONSET DATE: acute on chronic ~ 6 years ago but more acute exacerbations as well   REFERRING DIAG: M79.641,M79.642 (ICD-10-CM) - Pain in both hands   THERAPY DIAG:  Paresthesia of skin  Pain in right hand  Pain in left hand  Stiffness of right wrist, not elsewhere classified  Muscle weakness (generalized)  Other lack of coordination  Pain in right wrist  Rationale for Evaluation  and Treatment: Rehabilitation  PERTINENT HISTORY: Per MD notes: "refer to OT for hand pain and swelling and await rheumatology follow-up on evaluation. Recheck in 6 weeks."  Also having possible autoimmune issues and back pain/hip pain.    Imaging shows: "Patient also underwent an MR arthrogram which was performed on 04/06/2022. This was read as a tear of the distal lamina of the TFCC at the ulnar styloid attachment. The foveal attachment was intact. There was also some tendinosis of the ECU tendon just distal to the ulnar styloid. Patient states that she has been wearing a splint on as-needed basis. She has been using Mobic occasionally but she was developing some GI distress from taking so much anti-inflammatories over the past several months. " She presents very talkative and friendly, recalling events from 5-6 years to bil hands/wrists/hips/etc.  She states about 6 years ago she played volleyball and her Rt wrist snapped back painfully. A few months later the same thing happened on her Lt side. She states several exacerbations over the years.  She had some ulnar sided issues for which she got MRI from hand surgeon. She had cortisone injection and a nerve conduction study as well (normal result). She did have TFCC tear and she is also experiencing new Rt hand MF pain and swelling. She also c/o of sciatica. 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  Lastly, she discusses having paresthesia in her hands more so in the night and in the early mornings and she does admit to "sleeping on her hands and wrists" often.  PRECAUTIONS: None; WEIGHT BEARING RESTRICTIONS: No   SUBJECTIVE:   SUBJECTIVE STATEMENT: She states she is seeking nerve ablation for her lumbar pain and had recent f/u with her doctor.  At the end of her session, she states that she may have changes in insurance that prevent her from returning to therapy sessions.  PAIN:  Are you  having pain?   Pain  2-3/10 now  Pain location: Rt ulnar wrist, mainly (less in Rt MF and bil IF MCP Js) Pain description: sharp with some motions, aching Aggravating factors: rotating forearm  Relieving factors: rest    PATIENT GOALS: To improve the symptoms and her right wrist, bilateral hands also work on paresthesia that she has sometimes.    OBJECTIVE: (All objective assessments below are from initial evaluation on: 03/15/23 unless otherwise specified.)   HAND DOMINANCE: Right   ADLs: Overall ADLs: States decreased ability to grab, hold household objects, pain and inability to open containers, perform FMS tasks (manipulate fasteners on clothing), mild to moderate bathing problems as well.    FUNCTIONAL OUTCOME MEASURES: Eval: Quck DASH 44% impairment today  (Higher % Score  =  More Impairment)    UPPER EXTREMITY ROM     Shoulder to Wrist AROM Right eval Left eval Rt 03/29/23 Rt 04/15/23 Rt 04/22/23  Shoulder flexion    175   Shoulder abduction       Shoulder extension    70   Shoulder internal rotation    29 50  Shoulder external rotation    75 90  Elbow flexion       Elbow extension       Forearm supination 71 85 80    Forearm pronation  85 85 88    Wrist flexion 69 73 55  72  Wrist extension 54 77 55  69  Wrist ulnar deviation 20 46 18  20  Wrist radial deviation 24 20 15  29   (Blank rows = not tested)   Hand AROM Right eval Left eval  Full Fist Ability (or Gap to Distal Palmar Crease) Full, though tight in MF Full,  c/o some IF pain with fist   Thumb Opposition  (Kapandji Scale)  full full  (Blank rows = not tested)   UPPER EXTREMITY MMT:     MMT Right TBD Left TBD  Shoulder flexion    Shoulder abduction    Shoulder adduction    Shoulder extension    Shoulder internal rotation    Shoulder external rotation    Middle trapezius    Lower trapezius    Elbow flexion    Elbow extension    Forearm supination    Forearm pronation    Wrist flexion     Wrist extension    Wrist ulnar deviation    Wrist radial deviation    (Blank rows = not tested)  HAND FUNCTION: Eval: Observed weakness in affected hand.  Grip strength Right: 48 lbs, Left: 61 lbs   COORDINATION: 03/29/23: Box and Blocks Test: Rt: 67 blocks (68 is WFL)   SENSATION: Eval:  Light touch intact today, though c/o of diminished mainly in Rt hand median nerve distribution, more so at night and mornings (occasionally some work  tasks)   OBSERVATIONS:   Eval: positive piano key for Rt DRUJ (tender), positive TFCC shear test Rt, tender to palpation at Rt and Lt IF MCPJ, Tender to dorsal Rt MF MCP J and EDU does seem to shift ulnarly from top of MCP J. Rt ECU is also tender along muscle belly as well as extensors in Rt arm as well.   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   TODAY'S TREATMENT:  04/22/23: She arrives stating that her wrist is feeling much better and that she seems to have more concerns now for her low back problems as well as her consults about spinal radiculopathy.  She performs range of motion for the right arm showing significant improvements since beginning her stretch program which is likely helping with her pain.  Next, OT reviews her new sleeper stretches and shoulder internal rotation stretches which she demonstrates back well with no pain.  OT educates her on lying lumbar flexion and extension for gentle stretches and management of her back pain and stiffness.  She has no added pain with these significantly and she is also recommended to read the book "treat your own back" by Norval Morton, to help with her knowledge and management of her back pain.  Next, OT reviews the idea of pronation strengthening to strengthen pronator quadratus and stabilize the right wrist even in the  presence of TFCC chronic tear.  She tolerates this well and is given yellow therapy band and recommended to perform wrist flexion and extension and nonpainful planes of motion to also strengthen the wrist in these ways.  She demonstrates back stating understanding and leaves in no significant pain.  On her way out the door she states she may not be able to come back due to insurance problems.  Exercises - Wrist Flexion Stretch  - 4 x daily - 3-5 reps - 15 sec hold - Wrist Prayer Stretch  - 4 x daily - 3-5 reps - 15 sec hold - Standing neck/upper traps stretch  - 4-6 x daily - 3-5 reps - 15 sec hold - Sleeper Stretch  - 3-4 x daily - 5 reps - 15-20 sec hold - Hammer Stretch or Strength   - 2-4 x daily - 1-2 sets - 5-10 reps - 5-10 sec hold -Wrist flexion with yellow t-bands  -Wrist ext with yellow t-bands (both to be done 1-3 x day for 10-15 reps as tolerated    PATIENT EDUCATION: Education details: See tx section above for details  Person educated: Patient Education method: Verbal Instruction, Teach back, Handouts  Education comprehension: States and demonstrates understanding, Additional Education required    HOME EXERCISE PROGRAM: Access Code: [email or web address] URL: [email or web address] Date: 03/19/2023 Prepared by: Fannie Knee   GOALS: Goals reviewed with patient? Yes   SHORT TERM GOALS: (STG required if POC>30 days) Target Date: 04/02/23  Pt will obtain protective, custom orthotic. Goal status: 04/01/23: MET  2.  Pt will demo/state understanding of initial HEP to improve pain levels and prerequisite motion. Goal status: 03/25/23: MET   LONG TERM GOALS: Target Date: 04/30/23  Pt will improve functional ability by decreased impairment per Quick DASH assessment from 44% to 20% or better, for better quality of life. Goal status: INITIAL  2.  Pt will improve grip strength in Rt dom hand from  48lbs to at least 60lbs for functional use at home and in IADLs. Goal  status: INITIAL  3.  Pt will improve A/ROM in Rt FA supination from 71* tender to at least 80* non-tender, to have functional motion for tasks like reach and grasp.  Goal status: INITIAL  4.  Pt will improve strength in Rt wrist flex/ext from TBD/painful MMT to at least 4+/5 MMT to have increased functional ability to carry out selfcare and higher-level homecare tasks with no difficulty. Goal status: INITIAL  5.  Pt will improve coordination skills in Rt arm/wrist, as seen by [facility] score on BBT testing to have increased functional ability to carry out fine motor tasks (fasteners, etc.) and more complex, coordinated IADLs (meal prep, sports, etc.).  Goal status: INITIAL- TBD baseline next 1-2 sessions   6.  Pt will decrease pain at worst from 6-7/10 to 3/10 or better to have better sleep and occupational participation in daily roles. Goal status: INITIAL  ASSESSMENT:  CLINICAL IMPRESSION: 04/22/23: She only has 1 more week left on her plan of care but did state today unexpectedly she may need to stop therapy due to insurance change.  She has been doing better in terms of pain and stiffness through her arm and she does not show any major symptoms today.  In fact she discusses her back pain is more of an issue now.   PLAN:  OT FREQUENCY: 2x/week  OT DURATION: 6 weeks  PLANNED INTERVENTIONS: self care/ADL training, therapeutic exercise, therapeutic activity, neuromuscular re-education, manual therapy, passive range of motion, splinting, ultrasound, fluidotherapy, compression bandaging, moist heat, cryotherapy, contrast bath, patient/family education, energy conservation, coping strategies training, DME and/or AE instructions, and Dry needling  CONSULTED AND AGREED WITH PLAN OF CARE: Patient  PLAN FOR NEXT SESSION:   If she is able to return next week, this will be a progress note and potential discharge.  If she cannot return for what ever reason (insurance problem) she should be discharged  and considered likely to have met goals as she presents much improved and much less resting pain than start of care.  Fannie Knee, OTR/L 04/22/2023, 12:27 PM

## 2023-04-21 ENCOUNTER — Encounter: Payer: Self-pay | Admitting: Family Medicine

## 2023-04-21 ENCOUNTER — Ambulatory Visit: Payer: No Typology Code available for payment source | Admitting: Family Medicine

## 2023-04-21 VITALS — BP 110/68 | HR 68 | Ht 62.0 in | Wt 178.0 lb

## 2023-04-21 DIAGNOSIS — M545 Low back pain, unspecified: Secondary | ICD-10-CM

## 2023-04-21 DIAGNOSIS — M5416 Radiculopathy, lumbar region: Secondary | ICD-10-CM

## 2023-04-21 NOTE — Assessment & Plan Note (Addendum)
Patient was having radicular symptoms but now I do think that her pain is more secondary to facet arthropathy.  We discussed at great length about different treatment options.  We discussed at this point that I do feel that facet injections with medial branch block at the moment would be the most beneficial.  This will also give [place] a possibility to see if patient is a candidate for radiofrequency ablation.  Patient is in agreement with the plan will follow-up with [place] 8 weeks after the injections to see how patient responds.  Total time discussed with patient as well as reviewing other providers notes as well as patient's MRI 36 minutes.

## 2023-04-21 NOTE — Patient Instructions (Addendum)
Good to see you Turmeric 500mg  daily  Tart cherry extract 1200mg  at night Vitamin D 2000 IU daily  [facility] [phone] See me 2 months after the injection

## 2023-04-22 ENCOUNTER — Ambulatory Visit: Payer: No Typology Code available for payment source | Admitting: Rehabilitative and Restorative Service Providers"

## 2023-04-22 ENCOUNTER — Encounter: Payer: Self-pay | Admitting: Rehabilitative and Restorative Service Providers"

## 2023-04-22 DIAGNOSIS — M6281 Muscle weakness (generalized): Secondary | ICD-10-CM

## 2023-04-22 DIAGNOSIS — R278 Other lack of coordination: Secondary | ICD-10-CM

## 2023-04-22 DIAGNOSIS — M79642 Pain in left hand: Secondary | ICD-10-CM | POA: Diagnosis not present

## 2023-04-22 DIAGNOSIS — M25631 Stiffness of right wrist, not elsewhere classified: Secondary | ICD-10-CM

## 2023-04-22 DIAGNOSIS — M79641 Pain in right hand: Secondary | ICD-10-CM

## 2023-04-22 DIAGNOSIS — R202 Paresthesia of skin: Secondary | ICD-10-CM

## 2023-04-22 DIAGNOSIS — M25531 Pain in right wrist: Secondary | ICD-10-CM

## 2023-04-29 ENCOUNTER — Encounter: Payer: No Typology Code available for payment source | Admitting: Rehabilitative and Restorative Service Providers"

## 2023-05-04 ENCOUNTER — Other Ambulatory Visit: Payer: No Typology Code available for payment source

## 2024-09-06 NOTE — Telephone Encounter (Signed)
 error
